# Patient Record
Sex: Female | Born: 1980 | Race: White | Hispanic: No | Smoking: Never smoker
Health system: Southern US, Community
[De-identification: ages and names within clinical notes are randomized; demographics above are authoritative.]

## PROBLEM LIST (undated history)

## (undated) ENCOUNTER — Emergency Department (HOSPITAL_COMMUNITY): Admission: EM | Discharge status: Against Medical Advice | Payer: Medicaid Other

## (undated) DIAGNOSIS — M329 Systemic lupus erythematosus, unspecified: Secondary | ICD-10-CM

## (undated) DIAGNOSIS — IMO0002 Reserved for concepts with insufficient information to code with codable children: Secondary | ICD-10-CM

## (undated) DIAGNOSIS — Q613 Polycystic kidney, unspecified: Secondary | ICD-10-CM

---

## 2006-06-28 ENCOUNTER — Emergency Department (HOSPITAL_COMMUNITY): Admission: EM | Admit: 2006-06-28 | Discharge: 2006-06-28 | Payer: Self-pay | Admitting: Emergency Medicine

## 2007-04-06 ENCOUNTER — Emergency Department (HOSPITAL_COMMUNITY): Admission: EM | Admit: 2007-04-06 | Discharge: 2007-04-06 | Payer: Self-pay | Admitting: Emergency Medicine

## 2007-04-11 ENCOUNTER — Inpatient Hospital Stay (HOSPITAL_COMMUNITY): Admission: AD | Admit: 2007-04-11 | Discharge: 2007-04-11 | Payer: Self-pay | Admitting: Obstetrics and Gynecology

## 2007-04-11 ENCOUNTER — Ambulatory Visit: Payer: Self-pay | Admitting: *Deleted

## 2009-10-24 ENCOUNTER — Ambulatory Visit (HOSPITAL_COMMUNITY): Admission: RE | Admit: 2009-10-24 | Discharge: 2009-10-24 | Payer: Self-pay | Admitting: Obstetrics and Gynecology

## 2009-11-21 ENCOUNTER — Ambulatory Visit (HOSPITAL_COMMUNITY): Admission: RE | Admit: 2009-11-21 | Discharge: 2009-11-21 | Payer: Self-pay | Admitting: Obstetrics and Gynecology

## 2009-12-28 ENCOUNTER — Ambulatory Visit (HOSPITAL_COMMUNITY): Admission: RE | Admit: 2009-12-28 | Discharge: 2009-12-28 | Payer: Self-pay | Admitting: Obstetrics and Gynecology

## 2010-01-25 ENCOUNTER — Ambulatory Visit (HOSPITAL_COMMUNITY): Admission: RE | Admit: 2010-01-25 | Discharge: 2010-01-25 | Payer: Self-pay | Admitting: Obstetrics and Gynecology

## 2010-02-14 ENCOUNTER — Ambulatory Visit (HOSPITAL_COMMUNITY): Admission: RE | Admit: 2010-02-14 | Discharge: 2010-02-14 | Payer: Self-pay | Admitting: Obstetrics and Gynecology

## 2010-03-12 ENCOUNTER — Ambulatory Visit: Payer: Self-pay | Admitting: Emergency Medicine

## 2010-03-12 DIAGNOSIS — J45909 Unspecified asthma, uncomplicated: Secondary | ICD-10-CM | POA: Insufficient documentation

## 2010-03-12 DIAGNOSIS — J309 Allergic rhinitis, unspecified: Secondary | ICD-10-CM | POA: Insufficient documentation

## 2010-03-12 DIAGNOSIS — K219 Gastro-esophageal reflux disease without esophagitis: Secondary | ICD-10-CM | POA: Insufficient documentation

## 2010-04-09 ENCOUNTER — Ambulatory Visit: Payer: Self-pay | Admitting: Emergency Medicine

## 2010-05-07 ENCOUNTER — Encounter: Payer: Self-pay | Admitting: Emergency Medicine

## 2010-05-07 ENCOUNTER — Ambulatory Visit (HOSPITAL_COMMUNITY): Admission: RE | Admit: 2010-05-07 | Discharge: 2010-05-07 | Payer: Self-pay | Admitting: Emergency Medicine

## 2010-05-15 ENCOUNTER — Ambulatory Visit: Payer: Self-pay | Admitting: Emergency Medicine

## 2010-07-30 ENCOUNTER — Ambulatory Visit: Payer: Self-pay | Admitting: Emergency Medicine

## 2010-11-26 ENCOUNTER — Inpatient Hospital Stay (HOSPITAL_COMMUNITY)
Admission: EM | Admit: 2010-11-26 | Discharge: 2010-11-29 | Payer: Self-pay | Source: Home / Self Care | Attending: Internal Medicine | Admitting: Internal Medicine

## 2010-11-27 LAB — COMPREHENSIVE METABOLIC PANEL
ALT: 10 U/L (ref 0–35)
AST: 19 U/L (ref 0–37)
AST: 21 U/L (ref 0–37)
Albumin: 3.6 g/dL (ref 3.5–5.2)
BUN: 5 mg/dL — ABNORMAL LOW (ref 6–23)
CO2: 23 mEq/L (ref 19–32)
Calcium: 8.8 mg/dL (ref 8.4–10.5)
Creatinine, Ser: 0.65 mg/dL (ref 0.4–1.2)
GFR calc Af Amer: >60 mL/min (ref >60)
GFR calc Af Amer: >60 mL/min (ref >60)
GFR calc non Af Amer: >60 mL/min (ref >60)
Glucose, Bld: 136 mg/dL — ABNORMAL HIGH (ref 70–99)
Potassium: 3.6 mEq/L (ref 3.5–5.1)
Sodium: 139 mEq/L (ref 135–145)
Sodium: 138 mEq/L (ref 135–145)
Total Bilirubin: 0.7 mg/dL (ref 0.3–1.2)
Total Protein: 6.4 g/dL (ref 6.0–8.3)
Total Protein: 6 g/dL (ref 6.0–8.3)

## 2010-11-27 LAB — CBC
HCT: 36.5 % (ref 36.0–46.0)
HCT: 36.5 % (ref 36.0–46.0)
Hemoglobin: 11.7 g/dL — ABNORMAL LOW (ref 12.0–15.0)
MCH: 26.7 pg (ref 26.0–34.0)
MCHC: 31.8 g/dL (ref 30.0–36.0)
MCHC: 31.7 g/dL (ref 30.0–36.0)
MCV: 84.1 fL (ref 78.0–100.0)
MCV: 84.1 fL (ref 78.0–100.0)
Platelets: 129 10*3/uL — ABNORMAL LOW (ref 150–400)
Platelets: 139 10*3/uL — ABNORMAL LOW (ref 150–400)
RBC: 4.34 MIL/uL (ref 3.87–5.11)
RBC: 4.15 MIL/uL (ref 3.87–5.11)
RDW: 13.7 % (ref 11.5–15.5)
RDW: 14.1 % (ref 11.5–15.5)
WBC: 6.4 10*3/uL (ref 4.0–10.5)

## 2010-11-27 LAB — BASIC METABOLIC PANEL
BUN: 7 mg/dL (ref 6–23)
Chloride: 109 mEq/L (ref 96–112)
Potassium: 4.1 mEq/L (ref 3.5–5.1)
Sodium: 138 mEq/L (ref 135–145)

## 2010-11-27 LAB — VITAMIN B12: Vitamin B-12: 828 pg/mL (ref 211–911)

## 2010-11-27 LAB — URINALYSIS, ROUTINE W REFLEX MICROSCOPIC
Hgb urine dipstick: NEGATIVE
Ketones, ur: NEGATIVE mg/dL
Protein, ur: NEGATIVE mg/dL
Urobilinogen, UA: 0.2 mg/dL (ref 0.0–1.0)

## 2010-11-27 LAB — DIFFERENTIAL
Basophils Absolute: 0 10*3/uL (ref 0.0–0.1)
Basophils Relative: 0 % (ref 0–1)
Eosinophils Absolute: 0 10*3/uL (ref 0.0–0.7)
Monocytes Relative: 3 % (ref 3–12)

## 2010-11-27 LAB — RAPID URINE DRUG SCREEN, HOSP PERFORMED
Barbiturates: NOT DETECTED
Benzodiazepines: POSITIVE — AB
Cocaine: NOT DETECTED
Opiates: NOT DETECTED

## 2010-11-27 LAB — IRON AND TIBC
Iron: 63 ug/dL (ref 42–135)
UIBC: 239 ug/dL

## 2010-11-27 LAB — TSH: TSH: 0.374 u[IU]/mL (ref 0.350–4.500)

## 2010-11-27 LAB — FERRITIN: Ferritin: 6 ng/mL — ABNORMAL LOW (ref 10–291)

## 2010-11-27 LAB — PREGNANCY, URINE: Preg Test, Ur: NEGATIVE

## 2010-12-03 NOTE — Assessment & Plan Note (Signed)
Summary: asthma, allergies   Visit Type:  Follow-up Primary Provider/Referring Provider:  [facility], [place]  CC:  Followup asthma.  Pt states that after she first stopped qvar she noticed increased SOB.  She states that after she has started to exercise daily- walking and jogging and this has helped.  She denies any new complaints.  .  History of Present Illness: 30 yo never smoker, hx of childhood asthma and allergies not meds at that time, grew out of it. Then began to have more symptoms in early 20's. Dx with asthma with spirometry 2-3 yrs ago, was started on albuterol. Currently on QVAR and combivent, managed by [facility], [place]. Was not on meds during recent pregnancy (delivered April 24), symptoms worse. Had to be re-admitted to [facility] a week after the C-section.   Tells me that she has had progressive dyspnea, chest tightness, pain in back and chest; has cough, worst at night when lying flat, can wake her from sleep. Has had GERD in the past, doesn't complain of GERD now. Having allergy symptoms, worse this Spring.   ROV 04/09/10 -- returns for f/u probable asthma, GERD and allergies. We started allergy regimen last time, with success. She has URI symptoms x 1 week, rx for L ear infxn, has made her cough worse. Also has needed some SABA this week, was not requiring it prior.   ROV 05/15/10 -- returns for eval of allergies, cough and asthma. Since last time has been doing well. Adding loratadine and nasal steroid has helped her cough. No exacerbations since last time. She had methacholine on 05/07/10 that showed no airways hyperresponsiveness, but this study was done while she was on the QVAR two times a day!   ROV 07/30/10 -- asthma, cough, allergies. negative methacholine in 05/07/10 (on her QVAR!). We stopped QVAR last time, she has tolerated this well. hasn't required frequent SABA use. Has gone back to exercising. Occas gets cough and chest tightness at night while lying  flat.   Current Medications (verified): 1)  Ventolin Hfa 108 (90 Base) Mcg/act Aers (Albuterol Sulfate) .... 2 Puffs Q4h As Needed For Sob  Allergies (verified): 1)  ! Demerol (Meperidine Hcl)  Vital Signs:  Patient profile:   30 year old female Weight:      138.38 pounds O2 Sat:      98 % on Room air Temp:     98.2 degrees F oral Pulse rate:   109 / minute BP sitting:   102 / 70  (left arm)  Vitals Entered By: Vernie Murders (July 30, 2010 3:15 PM)  O2 Flow:  Room air  Physical Exam  General:  normal appearance, healthy appearing, and thin.   Head:  normocephalic and atraumatic Eyes:  conjunctiva and sclera clear Nose:  some mild nasal congestion Mouth:  no deformity or lesions Neck:  no stridor Chest Wall:  no deformities noted Lungs:  clear bilaterally to auscultation and percussion Heart:  regular rate and rhythm, S1, S2 without murmurs, rubs, gallops, or clicks Abdomen:  not examined Msk:  no deformity or scoliosis noted with normal posture Extremities:  no clubbing, cyanosis, edema, or deformity noted Neurologic:  non-focal Skin:  intact without lesions or rashes Psych:  alert and cooperative; normal mood and affect; normal attention span and concentration   Impression & Recommendations:  Problem # 1:  ASTHMA (ICD-493.90) Unclear whether she has true asthma - methacholine was negative but done on ICS. has tolerated d/c of QVAR - continue  SABA as needed - rov 6 mo or as needed   Problem # 2:  ALLERGIC RHINITIS (ICD-477.9)  Quiet right now - restart claritin if she flares.   Orders: Est. Patient Level III ([address])  Patient Instructions: 1)  Continue to have your Ventolin avalable to use as needed  2)  You can restart claritin 10mg  by mouth once daily if your allergies flare. 3)  Follow up with Dr Delton Coombes in 6 months or as needed  Prescriptions: VENTOLIN HFA 108 (90 BASE) MCG/ACT AERS (ALBUTEROL SULFATE) 2 puffs q4h as needed for SOB  #1 x 11   Entered  and Authorized by:   Leslye Peer MD   Signed by:   Leslye Peer MD on 07/30/2010   Method used:   Electronically to        [facility]  [address] (retail)       [address]       [place], [place]  [address]       Ph: [phone] or [phone]       Fax: [phone]   RxID:   [id number]

## 2010-12-03 NOTE — Miscellaneous (Signed)
Summary: Orders Update   Clinical Lists Changes  Orders: Added new Service order of Est. Patient Level IV (99214) - Signed 

## 2010-12-03 NOTE — Procedures (Signed)
Summary: Spirometry/[place]  Spirometry/[place]   Imported By: Sherian Rein 05/21/2010 09:02:42  _____________________________________________________________________  External Attachment:    Type:   Image     Comment:   External Document

## 2010-12-03 NOTE — Assessment & Plan Note (Signed)
Summary: asthma, allergies   Visit Type:  Initial Consult Primary Provider/Referring Provider:  [facility] [place]  CC:  Asthma Consult self referral .  History of Present Illness: 30 yo never smoker, hx of childhood asthma and allergies not meds at that time, grew out of it. Then began to have more symptoms in early 20's. Dx with asthma with spirometry 2-3 yrs ago, was started on albuterol. Currently on QVAR and combivent, managed by [facility], [place]. Was not on meds during recent pregnancy (delivered April 24), symptoms worse. Had to be re-admitted to [facility] a week after the C-section.   Tells me that she has had progressive dyspnea, chest tightness, pain in back and chest; has cough, worst at night when lying flat, can wake her from sleep. Has had GERD in the past, doesn't complain of GERD now. Having allergy symptoms, worse this Spring.   Allergies (verified): 1)  ! 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    Vital Signs:  Patient profile:   30 year old female Height:      70 inches Weight:      131 pounds BMI:     18.86 O2 Sat:      98 % on Room air Temp:     98 degrees F oral Pulse rate:   76 / minute BP sitting:   104 / 78  (left arm)  Vitals Entered By: Renold Genta RCP, LPN (Mar 12, 2010 9:10 AM)  O2 Flow:  Room air CC: Asthma Consult self referral  Comments Medications reviewed with patient Renold Genta RCP, LPN  Mar 12, 2010 9:10 AM    Physical Exam  General:  normal appearance, healthy appearing, and  thin.   Head:  normocephalic and atraumatic Eyes:  conjunctiva and sclera clear Nose:  no deformity, discharge, inflammation, or lesions Mouth:  no deformity or lesions Neck:  no stridor Chest Wall:  no deformities noted Lungs:  clear bilaterally to auscultation and percussion Heart:  regular rate and rhythm, S1, S2 without murmurs, rubs, gallops, or clicks Abdomen:  not examined Msk:  no deformity or scoliosis noted with normal posture Extremities:  no clubbing, cyanosis, edema, or deformity noted Neurologic:  non-focal Skin:  intact without lesions or rashes Psych:  alert and cooperative; normal mood and affect; normal attention span and concentration   Pulmonary Function Test Date: 03/12/2010 10:03 AM Gender: Female  Pre-Spirometry FVC    Value: 4.09 L/min   % Pred: 90.60 % FEV1    Value: 3.50 L     Pred: 3.81 L     % Pred: 91.80 % FEV1/FVC  Value: 85.58 %     % Pred: 100.30 %  Impression & Recommendations:  Problem # 1:  ASTHMA (ICD-493.90) Probable asthma with marginal control, likely due to allergies and ? GERD. Cleda Daub today with normal airflows pre-BD. Not celar to me why she is on combivent instead of albuterol alone.  - d/c combivent  - continue QVAR and use Ventolin as needed  - attempt to control allergies as below - empiric rx for GERD - will get  either full PFT or more likely methacholine challenge after next visit; want to see how she responds to allergy regimen first - rov 3 -4 week  Problem # 2:  ALLERGIC RHINITIS (ICD-477.9)  Her updated medication list for this problem includes:    Loratadine 10 Mg Tabs (Loratadine) .Marland Kitchen... 1 by mouth once daily    Fluticasone Propionate 50 Mcg/act Susp (Fluticasone propionate) .Marland Kitchen... 2 sprays each nostril two times a day  Problem # 3:  GERD (ICD-530.81) empiric omeprazole  Medications Added to Medication List This Visit: 1)  Qvar 40 Mcg/act Aers (Beclomethasone dipropionate) .... 2 puffs two times a day 2)  Combivent  18-103 Mcg/act Aero (Ipratropium-albuterol) .... 2 puffs four times a day 3)  Ventolin Hfa 108 (90 Base) Mcg/act Aers (Albuterol sulfate) .... 2 puffs q4h as needed for sob 4)  Loratadine 10 Mg Tabs (Loratadine) .Marland Kitchen.. 1 by mouth once daily 5)  Fluticasone Propionate 50 Mcg/act Susp (Fluticasone propionate) .... 2 sprays each nostril two times a day  Other Orders: Consultation Level IV (16109) Spirometry w/Graph ([address]) Prescription Created Electronically [phone])  Patient Instructions: 1)  Continue QVAR 40, 2 puffs two times a day. Rinse your mouth out after you use. 2)  Stop combivent 3)  Start Ventolin 2 puffs as needed  4)  Start loratadine 10mg  by mouth once daily (Claritin) 5)  Start fluticasone 2 sprays each nostril two times a day  6)  We will arrange more pulmonary function testing after your next visit 7)  Follow up with Dr Delton Coombes in 3 -4 weeks  Prescriptions: FLUTICASONE PROPIONATE 50 MCG/ACT SUSP (FLUTICASONE PROPIONATE) 2 sprays each nostril two times a day  #1 x 5   Entered and Authorized by:   Leslye Peer MD   Signed by:   Leslye Peer MD on 03/12/2010   Method used:   Electronically to        [facility]  [address] (retail)       [address]       [place], [place]  [address]       Ph: [phone] or [phone]       Fax: [phone]   RxID:   [id number] LORATADINE 10 MG TABS (LORATADINE) 1 by mouth once daily  #30 x 5   Entered and Authorized by:   Leslye Peer MD   Signed by:   Leslye Peer MD on 03/12/2010   Method used:   Electronically to        [facility]  [address] (retail)       [address]       [place], [place]  [address]       Ph: [phone] or [phone]       Fax: [phone]   RxID:   [id number] VENTOLIN HFA 108 (90 BASE) MCG/ACT AERS (ALBUTEROL SULFATE) 2 puffs q4h as needed for SOB  #1 x 5   Entered and Authorized by:   Leslye Peer MD   Signed by:   Leslye Peer MD on 03/12/2010    Method used:   Electronically to        [facility]  [address] (retail)       [address]       [place], [place]  [address]  Ph: [phone] or [phone]       Fax: [phone]   RxID:   [id number] QVAR 40 MCG/ACT AERS (BECLOMETHASONE DIPROPIONATE) 2 puffs two times a day  #1 x 5   Entered and Authorized by:   Leslye Peer MD   Signed by:   Leslye Peer MD on 03/12/2010   Method used:   Electronically to        [facility]  [address] (retail)       [address]       [place], [place]  [address]       Ph: [phone] or [phone]       Fax: [phone]   RxID:   [id number]    [facility] Spirometry  ID: [id number] Patient: Christina Tyler, Christina Tyler DOB: November 10, 1980 Age: 30 Years Old Sex: Female Race: White Height: 70 Weight: 131 Smoker: No Status: Confirmed Recorded: 03/12/2010 10:03 AM  Parameter  Measured Predicted %Predicted FVC     4.09        4.51        90.60 FEV1     3.50        3.81        91.80 FEV1%   85.58        85.28        100.30 PEF    7.37        7.87        93.70   Interpretation: Pre: FVC= 4.09L FEV1= 3.50L FEV1%= 85.6% 3.50/4.09 FEV1/FVC (03/12/2010 10:05:05 AM),   Normal airflows. RSB

## 2010-12-03 NOTE — Assessment & Plan Note (Signed)
Summary: allergies, ? asthma   Visit Type:  Follow-up Primary Provider/Referring Provider:  [facility] [place]  CC:  followup on methacholine test.  History of Present Illness: 30 yo never smoker, hx of childhood asthma and allergies not meds at that time, grew out of it. Then began to have more symptoms in early 20's. Dx with asthma with spirometry 2-3 yrs ago, was started on albuterol. Currently on QVAR and combivent, managed by [facility], [place]. Was not on meds during recent pregnancy (delivered April 24), symptoms worse. Had to be re-admitted to [facility] a week after the C-section.   Tells me that she has had progressive dyspnea, chest tightness, pain in back and chest; has cough, worst at night when lying flat, can wake her from sleep. Has had GERD in the past, doesn't complain of GERD now. Having allergy symptoms, worse this Spring.   ROV 04/09/10 -- returns for f/u probable asthma, GERD and allergies. We started allergy regimen last time, with success. She has URI symptoms x 1 week, rx for L ear infxn, has made her cough worse. Also has needed some SABA this week, was not requiring it prior.   ROV 05/15/10 -- returns for eval of allergies, cough and asthma. Since last time has been doing well. Adding loratadine and nasal steroid has helped her cough. No exacerbations since last time. She had methacholine on 05/07/10 that showed no airways hyperresponsiveness, but this study was done while she was on the QVAR two times a day!   Current Medications (verified): 1)  Qvar 40 Mcg/act Aers (Beclomethasone Dipropionate) .... 2 Puffs Two Times A Day 2)  Ventolin Hfa 108 (90 Base) Mcg/act Aers (Albuterol Sulfate) .... 2 Puffs Q4h As Needed For Sob 3)  Loratadine 10 Mg Tabs (Loratadine) .Marland Kitchen.. 1 By Mouth Once Daily 4)  Fluticasone Propionate 50 Mcg/act Susp (Fluticasone Propionate) .... 2 Sprays Each Nostril Two Times A Day 5)  Prednisone (Pak) 10 Mg Tabs (Prednisone) .... For 6  Days  Allergies (verified): 1)  ! Demerol (Meperidine Hcl)  Vital Signs:  Patient profile:   30 year old female Height:      70 inches Weight:      136.38 pounds BMI:     19.64 O2 Sat:      97 % on Room air Pulse rate:   77 / minute BP sitting:   90 / 68  (right arm) Cuff size:   regular  Vitals Entered By: Kandice Hams CMA (May 15, 2010 2:00 PM)  O2 Flow:  Room air  Physical Exam  General:  normal appearance, healthy appearing, and thin.   Head:  normocephalic and atraumatic Eyes:  conjunctiva and sclera clear Nose:  some mild nasal congestion Mouth:  no deformity or lesions Neck:  no stridor Chest Wall:  no deformities noted Lungs:  clear bilaterally to auscultation and percussion Heart:  regular rate and rhythm, S1, S2 without murmurs, rubs, gallops, or clicks Abdomen:  not examined Msk:  no deformity or scoliosis noted with normal posture Extremities:  no clubbing, cyanosis, edema, or deformity noted Neurologic:  non-focal Skin:  intact without lesions or rashes Psych:  alert and cooperative; normal mood and affect; normal attention span and concentration   Impression & Recommendations:  Problem # 1:  ALLERGIC RHINITIS (ICD-477.9)  Her updated medication list for this problem includes:    Loratadine 10 Mg Tabs (Loratadine) .Marland Kitchen... 1 by mouth once daily    Fluticasone Propionate 50 Mcg/act Susp (Fluticasone propionate) .Marland KitchenMarland KitchenMarland KitchenMarland Kitchen  2 sprays each nostril two times a day  Problem # 2:  ASTHMA (ICD-493.90) Still not clear to me that we can rule out asthma because her normal methacholine was done on QVAR. She is willing to trial off the QVAR, assess her symptoms and airflows. If she worsens we will either restart QVAR or repeat the methacholine off meds. Her peak flow today was  ~460.  - stop QVAR - monitor symptoms and peak flows - rov in 6 weeks to assess status off med - ventolin as needed  - consider repeat methacholine vs empiric restart of QVAR i fshe declines.    Medications Added to Medication List This Visit: 1)  Prednisone (pak) 10 Mg Tabs (Prednisone) .... For 6 days  Patient Instructions: 1)  Stop QVAR 2)  Journal your symptoms and your peak flows for our follow up visit 3)  Keep Ventolin available to use as needed  4)  Follow up with Dr Delton Coombes in 6 weeks or as needed

## 2010-12-03 NOTE — Assessment & Plan Note (Signed)
Summary: allergies, asthma   Visit Type:  Follow-up Primary Provider/Referring Provider:  [facility] [place]  CC:  The patient c/o cough with clear mucus that has gotten worse since starting Augmentin for ear infection. she also c/o chest tightness. SOB is about the same. No wheezing.Marland Kitchen  History of Present Illness: 30 yo never smoker, hx of childhood asthma and allergies not meds at that time, grew out of it. Then began to have more symptoms in early 20's. Dx with asthma with spirometry 2-3 yrs ago, was started on albuterol. Currently on QVAR and combivent, managed by [facility], [place]. Was not on meds during recent pregnancy (delivered April 24), symptoms worse. Had to be re-admitted to [facility] a week after the C-section.   Tells me that she has had progressive dyspnea, chest tightness, pain in back and chest; has cough, worst at night when lying flat, can wake her from sleep. Has had GERD in the past, doesn't complain of GERD now. Having allergy symptoms, worse this Spring.   ROV 04/09/10 -- returns for f/u probable asthma, GERD and allergies. We started allergy regimen last time, with success. She has URI symptoms x 1 week, rx for L ear infxn, has made her cough worse. Also has needed some SABA this week, was not requiring it prior.  h Current Medications (verified): 1)  Qvar 40 Mcg/act Aers (Beclomethasone Dipropionate) .... 2 Puffs Two Times A Day 2)  Ventolin Hfa 108 (90 Base) Mcg/act Aers (Albuterol Sulfate) .... 2 Puffs Q4h As Needed For Sob 3)  Loratadine 10 Mg Tabs (Loratadine) .Marland Kitchen.. 1 By Mouth Once Daily 4)  Fluticasone Propionate 50 Mcg/act Susp (Fluticasone Propionate) .... 2 Sprays Each Nostril Two Times A Day 5)  Augmentin 875-125 Mg Tabs (Amoxicillin-Pot Clavulanate) .Marland Kitchen.. 1 By Mouth Two Times A Day  Allergies (verified): 1)  ! Demerol (Meperidine Hcl)  Vital Signs:  Patient profile:   30 year old female Height:      70 inches (177.80 cm) Weight:      132  pounds (60.00 kg) BMI:     19.01 O2 Sat:      98 % on Room air Temp:     98.2 degrees F (36.78 degrees C) oral Pulse rate:   77 / minute BP sitting:   102 / 68  (right arm) Cuff size:   regular  Vitals Entered By: Michel Bickers CMA (April 09, 2010 11:30 AM)  O2 Sat at Rest %:  98 O2 Flow:  Room air CC: The patient c/o cough with clear mucus that has gotten worse since starting Augmentin for ear infection. she also c/o chest tightness. SOB is about the same. No wheezing. Comments Medications reviewed. Daytime phone verified. Michel Bickers CMA  April 09, 2010 11:31 AM   Physical Exam  General:  normal appearance, healthy appearing, and thin.   Head:  normocephalic and atraumatic Eyes:  conjunctiva and sclera clear Nose:  some mild nasal congestion Mouth:  no deformity or lesions Neck:  no stridor Chest Wall:  no deformities noted Lungs:  clear bilaterally to auscultation and percussion Heart:  regular rate and rhythm, S1, S2 without murmurs, rubs, gallops, or clicks Abdomen:  not examined Msk:  no deformity or scoliosis noted with normal posture Extremities:  no clubbing, cyanosis, edema, or deformity noted Neurologic:  non-focal Skin:  intact without lesions or rashes Psych:  alert and cooperative; normal mood and affect; normal attention span and concentration   Impression & Recommendations:  Problem # 1:  ASTHMA (ICD-493.90)  Problem # 2:  ALLERGIC RHINITIS (ICD-477.9)  Her updated medication list for this problem includes:    Loratadine 10 Mg Tabs (Loratadine) .Marland Kitchen... 1 by mouth once daily    Fluticasone Propionate 50 Mcg/act Susp (Fluticasone propionate) .Marland Kitchen... 2 sprays each nostril two times a day  Orders: Est. Patient Level IV ([address])  Medications Added to Medication List This Visit: 1)  Augmentin 875-125 Mg Tabs (Amoxicillin-pot clavulanate) .Marland Kitchen.. 1 by mouth two times a day  Other Orders: Pulmonary Referral (Pulmonary)  Patient Instructions: 1)  Please continue  your loratadine and fluticisone spray as you are taking them.  2)  Use QVAR two times a day  3)  Use Ventolin as needed.  4)  We will schedule a methacholine challenge at [facility] to assess your asthma.  5)  Follow up with Dr Delton Coombes after your testing is completed to review the results.
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
## 2011-01-10 ENCOUNTER — Ambulatory Visit: Payer: Self-pay | Admitting: Emergency Medicine

## 2011-01-13 ENCOUNTER — Telehealth: Payer: Self-pay | Admitting: Emergency Medicine

## 2011-01-21 NOTE — Progress Notes (Signed)
Summary: nos appt  Phone Note Call from Patient   Caller: [email or web address]  Call For: byrum Summary of Call: Rsc nos from 3/9 to 4/11. Initial call taken by: Darletta Moll,  January 13, 2011 9:52 AM

## 2011-02-10 ENCOUNTER — Encounter: Payer: Self-pay | Admitting: Emergency Medicine

## 2011-02-12 ENCOUNTER — Encounter: Payer: Self-pay | Admitting: Emergency Medicine

## 2011-02-12 ENCOUNTER — Ambulatory Visit (INDEPENDENT_AMBULATORY_CARE_PROVIDER_SITE_OTHER): Payer: Medicaid Other | Admitting: Emergency Medicine

## 2011-02-12 DIAGNOSIS — J309 Allergic rhinitis, unspecified: Secondary | ICD-10-CM

## 2011-02-12 DIAGNOSIS — J45909 Unspecified asthma, uncomplicated: Secondary | ICD-10-CM

## 2011-02-12 MED ORDER — LORATADINE 10 MG PO TABS: 10.0000 mg | ORAL_TABLET | Freq: Every day | ORAL | Status: DC

## 2011-02-12 MED ORDER — FLUTICASONE PROPIONATE 50 MCG/ACT NA SUSP: 2.0000 | Freq: Two times a day (BID) | NASAL | Status: DC

## 2011-02-12 NOTE — Assessment & Plan Note (Signed)
Add loratadine + fluticasone during the allergy season

## 2011-02-12 NOTE — Patient Instructions (Addendum)
Continue to use Ventolin as needed Start loratadine 10mg  daily Start fluticasone nasal spray, 2 sprays each nostril twice a day We may decide to repeat your methacholine challenge at some point in the future.  Follow up with Dr Delton Coombes in 6 months or sooner if you develop any problems.

## 2011-02-12 NOTE — Assessment & Plan Note (Signed)
Continue off ICS, use prn SABA alone Consider repeat methacholine at some point in future now that she is off ICS rov 6 months or prn

## 2011-02-12 NOTE — Progress Notes (Signed)
  Subjective:    Patient ID: Christina Tyler, female    DOB: Mar 05, 1981, 30 y.o.   MRN: [id number]  HPI 30 yo never smoker, hx of childhood asthma and allergies not meds at that time, grew out of it. Then began to have more symptoms in early 20's. Dx with asthma with spirometry 2-3 yrs ago, was started on albuterol. Currently on QVAR and combivent, managed by [facility], [place]. Was not on meds during recent pregnancy (delivered April 24), symptoms worse. Had to be re-admitted to [facility] a week after the C-section.   Tells me that she has had progressive dyspnea, chest tightness, pain in back and chest; has cough, worst at night when lying flat, can wake her from sleep. Has had GERD in the past, doesn't complain of GERD now. Having allergy symptoms, worse this Spring.   ROV 04/09/10 -- returns for f/u probable asthma, GERD and allergies. We started allergy regimen last time, with success. She has URI symptoms x 1 week, rx for L ear infxn, has made her cough worse. Also has needed some SABA this week, was not requiring it prior.   ROV 05/15/10 -- returns for eval of allergies, cough and asthma. Since last time has been doing well. Adding loratadine and nasal steroid has helped her cough. No exacerbations since last time. She had methacholine on 05/07/10 that showed no airways hyperresponsiveness, but this study was done while she was on the QVAR two times a day!   ROV 07/30/10 -- asthma, cough, allergies. negative methacholine in 05/07/10 (on her QVAR!). We stopped QVAR last time, she has tolerated this well. hasn't required frequent SABA use. Has gone back to exercising. Occas gets cough and chest tightness at night while lying flat.   ROV 02/12/11 -- asthma, allergies, cough. Has done well since 9/11. She can tell that her allergies are starting to act up. Drainage at night. She hasn't had to increase Ventolin usage. No exacerbations, no hospitalizations since last time.   Review of  Systems Respiratory ROS: negative for - cough, hemoptysis, orthopnea, pleuritic pain, shortness of breath, sputum changes, stridor, tachypnea or wheezing    Objective:   Physical Exam Gen: Pleasant, well-nourished, in no distress,  normal affect  ENT: No lesions,  mouth clear,  oropharynx clear, no postnasal drip  Neck: No JVD, no TMG, no carotid bruits  Lungs: No use of accessory muscles, no dullness to percussion, clear without rales or rhonchi  Cardiovascular: RRR, heart sounds normal, no murmur or gallops, no peripheral edema  Musculoskeletal: No deformities, no cyanosis or clubbing  Neuro: alert, non focal  Skin: Warm, no lesions or rashes        Assessment & Plan:  No problem-specific assessment & plan notes found for this encounter.

## 2011-03-22 ENCOUNTER — Emergency Department (HOSPITAL_COMMUNITY): Payer: Medicaid Other

## 2011-03-22 ENCOUNTER — Emergency Department (HOSPITAL_COMMUNITY)
Admission: EM | Admit: 2011-03-22 | Discharge: 2011-03-22 | Disposition: A | Discharge status: Home / Self Care | Payer: Medicaid Other | Attending: Emergency Medicine | Admitting: Emergency Medicine

## 2011-03-22 DIAGNOSIS — M79609 Pain in unspecified limb: Secondary | ICD-10-CM | POA: Insufficient documentation

## 2011-03-22 DIAGNOSIS — R209 Unspecified disturbances of skin sensation: Secondary | ICD-10-CM | POA: Insufficient documentation

## 2011-03-22 DIAGNOSIS — R51 Headache: Secondary | ICD-10-CM | POA: Insufficient documentation

## 2011-03-22 DIAGNOSIS — J45909 Unspecified asthma, uncomplicated: Secondary | ICD-10-CM | POA: Insufficient documentation

## 2011-07-31 ENCOUNTER — Other Ambulatory Visit: Payer: Self-pay | Admitting: Emergency Medicine

## 2011-08-13 ENCOUNTER — Ambulatory Visit (INDEPENDENT_AMBULATORY_CARE_PROVIDER_SITE_OTHER): Payer: Medicaid Other | Admitting: Emergency Medicine

## 2011-08-13 ENCOUNTER — Encounter: Payer: Self-pay | Admitting: Emergency Medicine

## 2011-08-13 VITALS — BP 100/70 | HR 70 | Temp 98.5°F | Ht 70.0 in | Wt 144.0 lb

## 2011-08-13 DIAGNOSIS — J45909 Unspecified asthma, uncomplicated: Secondary | ICD-10-CM

## 2011-08-13 DIAGNOSIS — Z23 Encounter for immunization: Secondary | ICD-10-CM

## 2011-08-13 NOTE — Assessment & Plan Note (Signed)
-   continue SABA prn,  - no scheduled meds at this time - rov in 6 months or prn

## 2011-08-13 NOTE — Patient Instructions (Addendum)
Please continue to use your Ventolin as needed We will not start any scheduled asthma medications at this time Flu Shot today Follow with Dr Delton Coombes in 6 months or sooner if you have any problems.

## 2011-08-13 NOTE — Progress Notes (Signed)
  Subjective:    Patient ID: Christina Tyler, female    DOB: 04/20/1981, 30 y.o.   MRN: [id number]  HPI 30 yo never smoker, hx of childhood asthma and allergies not meds at that time, grew out of it. Then began to have more symptoms in early 20's. Dx with asthma with spirometry 2-3 yrs ago, was started on albuterol. Currently on QVAR and combivent, managed by [facility], [place]. Was not on meds during recent pregnancy (delivered April 24), symptoms worse. Had to be re-admitted to [facility] a week after the C-section.   Tells me that she has had progressive dyspnea, chest tightness, pain in back and chest; has cough, worst at night when lying flat, can wake her from sleep. Has had GERD in the past, doesn't complain of GERD now. Having allergy symptoms, worse this Spring.   ROV 04/09/10 -- returns for f/u probable asthma, GERD and allergies. We started allergy regimen last time, with success. She has URI symptoms x 1 week, rx for L ear infxn, has made her cough worse. Also has needed some SABA this week, was not requiring it prior.   ROV 05/15/10 -- returns for eval of allergies, cough and asthma. Since last time has been doing well. Adding loratadine and nasal steroid has helped her cough. No exacerbations since last time. She had methacholine on 05/07/10 that showed no airways hyperresponsiveness, but this study was done while she was on the QVAR two times a day!   ROV 07/30/10 -- asthma, cough, allergies. negative methacholine in 05/07/10 (on her QVAR!). We stopped QVAR last time, she has tolerated this well. hasn't required frequent SABA use. Has gone back to exercising. Occas gets cough and chest tightness at night while lying flat.   ROV 02/12/11 -- asthma, allergies, cough. Has done well since 9/11. She can tell that her allergies are starting to act up. Drainage at night. She hasn't had to increase Ventolin usage. No exacerbations, no hospitalizations since last time.   ROV 08/13/11 -- asthma,  allergies, cough. Newly dx of SLE made since our last visit - discovered with anemia and thrombocytopenia. Not currently on medication. Asthma has been well managed until recent weather change, using SABA 2 -3 x a week. Needs flu shot.   Review of Systems Respiratory ROS: negative for - cough, hemoptysis, orthopnea, pleuritic pain, shortness of breath, sputum changes, stridor, tachypnea or wheezing    Objective:   Physical Exam Gen: Pleasant, well-nourished, in no distress,  normal affect  ENT: No lesions,  mouth clear,  oropharynx clear, no postnasal drip  Neck: No JVD, no TMG, no carotid bruits  Lungs: No use of accessory muscles, no dullness to percussion, clear without rales or rhonchi  Cardiovascular: RRR, heart sounds normal, no murmur or gallops, no peripheral edema  Musculoskeletal: No deformities, no cyanosis or clubbing  Neuro: alert, non focal  Skin: Warm, no lesions or rashes    Assessment & Plan:  ASTHMA - continue SABA prn,  - no scheduled meds at this time - rov in 6 months or prn

## 2011-08-13 NOTE — Progress Notes (Signed)
Addended by: Michel Bickers A on: 08/13/2011 03:43 PM   Modules accepted: Orders

## 2011-09-12 ENCOUNTER — Emergency Department (HOSPITAL_COMMUNITY)
Admission: EM | Admit: 2011-09-12 | Discharge: 2011-09-12 | Disposition: A | Discharge status: Home / Self Care | Payer: Medicaid Other | Attending: Emergency Medicine | Admitting: Emergency Medicine

## 2011-09-12 ENCOUNTER — Encounter (HOSPITAL_COMMUNITY): Payer: Self-pay | Admitting: *Deleted

## 2011-09-12 ENCOUNTER — Emergency Department (HOSPITAL_COMMUNITY): Payer: Medicaid Other

## 2011-09-12 DIAGNOSIS — N949 Unspecified condition associated with female genital organs and menstrual cycle: Secondary | ICD-10-CM | POA: Insufficient documentation

## 2011-09-12 DIAGNOSIS — Z79899 Other long term (current) drug therapy: Secondary | ICD-10-CM | POA: Insufficient documentation

## 2011-09-12 DIAGNOSIS — J45909 Unspecified asthma, uncomplicated: Secondary | ICD-10-CM | POA: Insufficient documentation

## 2011-09-12 DIAGNOSIS — M329 Systemic lupus erythematosus, unspecified: Secondary | ICD-10-CM | POA: Insufficient documentation

## 2011-09-12 DIAGNOSIS — R109 Unspecified abdominal pain: Secondary | ICD-10-CM | POA: Insufficient documentation

## 2011-09-12 HISTORY — DX: Reserved for concepts with insufficient information to code with codable children: IMO0002

## 2011-09-12 HISTORY — DX: Polycystic kidney, unspecified: Q61.3

## 2011-09-12 HISTORY — DX: Systemic lupus erythematosus, unspecified: M32.9

## 2011-09-12 LAB — DIFFERENTIAL
Basophils Absolute: 0.1 10*3/uL (ref 0.0–0.1)
Lymphocytes Relative: 40 % (ref 12–46)
Neutro Abs: 2 10*3/uL (ref 1.7–7.7)

## 2011-09-12 LAB — COMPREHENSIVE METABOLIC PANEL
ALT: 8 U/L (ref 0–35)
AST: 14 U/L (ref 0–37)
CO2: 26 mEq/L (ref 19–32)
Chloride: 108 mEq/L (ref 96–112)
GFR calc non Af Amer: >90 mL/min (ref >90)
Sodium: 141 mEq/L (ref 135–145)
Total Bilirubin: 0.3 mg/dL (ref 0.3–1.2)

## 2011-09-12 LAB — URINALYSIS, ROUTINE W REFLEX MICROSCOPIC
Glucose, UA: NEGATIVE mg/dL
Protein, ur: 30 mg/dL — AB
pH: 6 (ref 5.0–8.0)

## 2011-09-12 LAB — CBC
Platelets: 120 10*3/uL — ABNORMAL LOW (ref 150–400)
RDW: 13.3 % (ref 11.5–15.5)
WBC: 4.3 10*3/uL (ref 4.0–10.5)

## 2011-09-12 LAB — URINE MICROSCOPIC-ADD ON

## 2011-09-12 MED ORDER — DIAZEPAM 5 MG PO TABS: 5.0000 mg | ORAL_TABLET | Freq: Three times a day (TID) | ORAL | Status: AC | PRN

## 2011-09-12 MED ORDER — OXYCODONE-ACETAMINOPHEN 5-325 MG PO TABS: 1.0000 | ORAL_TABLET | ORAL | Status: AC | PRN

## 2011-09-12 MED ORDER — IOHEXOL 300 MG/ML  SOLN
80.0000 mL | Freq: Once | INTRAMUSCULAR | Status: AC | PRN
  Administered 2011-09-12: 80 mL via INTRAVENOUS

## 2011-09-12 MED ORDER — DIPHENHYDRAMINE HCL 50 MG/ML IJ SOLN
INTRAMUSCULAR | Status: AC
  Filled 2011-09-12: qty 1

## 2011-09-12 MED ORDER — DIPHENHYDRAMINE HCL 50 MG/ML IJ SOLN
12.5000 mg | Freq: Once | INTRAMUSCULAR | Status: AC
  Administered 2011-09-12: 12.5 mg via INTRAVENOUS

## 2011-09-12 MED ORDER — DIPHENHYDRAMINE HCL 12.5 MG/5ML PO ELIX: 12.5000 mg | ORAL_SOLUTION | Freq: Once | ORAL | Status: DC

## 2011-09-12 MED ORDER — KETOROLAC TROMETHAMINE 30 MG/ML IJ SOLN
30.0000 mg | Freq: Once | INTRAMUSCULAR | Status: AC
  Administered 2011-09-12: 30 mg via INTRAVENOUS
  Filled 2011-09-12: qty 1

## 2011-09-12 MED ORDER — MORPHINE SULFATE 4 MG/ML IJ SOLN
4.0000 mg | Freq: Once | INTRAMUSCULAR | Status: AC
  Administered 2011-09-12: 4 mg via INTRAVENOUS
  Filled 2011-09-12: qty 1

## 2011-09-12 NOTE — ED Provider Notes (Signed)
Pt seen and examined by me in CDU. PT with left flank pain for 4 weeks, worsening. Currently treated for UTI. Pt in CDU pending CT scan of abd/pelvis to r/o ruptured kidney cyst vs kidney stone.   8:12 PM Pt's CT scan resulted with no acute process. Possible that pain is musculoskeletal or related to currently treated UTI or polycystic kidney disease. WIll d/c home with pain medications and close follow up. Pt non toxic, normal vs.  Lottie Mussel, PA 09/12/11 2013

## 2011-09-12 NOTE — ED Notes (Signed)
Pt with left flank pain....states has had pain for the past week that is getting progressivly worse. States has history of polycystic kidney disease and thinks one of the cysts may have ruptured. Denies fever. Has been evaluated 3 times in past 2 weeks for this by 3 different providers.

## 2011-09-12 NOTE — ED Notes (Signed)
Pt given iv morphine...has local redness and itching at iv site.. Pa made aware  meds ordered accordingly

## 2011-09-12 NOTE — ED Provider Notes (Signed)
Evaluation and management procedures were performed by the PA/NP under my supervision/collaboration.   Dione Booze, MD 09/12/11 2023

## 2011-09-12 NOTE — ED Notes (Signed)
Pt has finished her po contrast.  Ct is aware

## 2011-09-12 NOTE — ED Provider Notes (Signed)
History     CSN: [id number] Arrival date & time: 09/12/2011 12:19 PM   First MD Initiated Contact with Patient 09/12/11 1441      Chief Complaint  Patient presents with  . Urinary Tract Infection    (Consider location/radiation/quality/duration/timing/severity/associated sxs/prior treatment) HPI History provided by patient.  Pt has h/o polycystic kidney disease.  Has had pain in L flank for the past 4-5 weeks.  Evaluated initially at [facility] ED and started on cipro for possible UTI.  Followed up with PCP and duration of abx extended.  Her nephrologist consulted and he thought pt may have a ruptured cyst.  Recommended that she come to ED if pain worsened.  Pt reports that pain has become more severe.  Describes as non-radiating stabbing/cramping.  Associated w/ hematuria last week but pt now has her period.  No other GU symptoms.  No fever.  No h/o kidney stones.   Past Medical History  Diagnosis Date  . Asthma   . Polycystic kidney disease   . Lupus     History reviewed. No pertinent past surgical history.  Family History  Problem Relation Age of Onset  . Asthma    . Heart disease      History  Substance Use Topics  . Smoking status: Never Smoker   . Smokeless tobacco: Never Used  . Alcohol Use: No    OB History    Grav Para Term Preterm Abortions TAB SAB Ect Mult Living                  Review of Systems  All other systems reviewed and are negative.    Allergies  Aspirin; Cymbalta; Dilaudid; and Nortriptyline  Home Medications   Current Outpatient Rx  Name Route Sig Dispense Refill  . CIPROFLOXACIN HCL 250 MG PO TABS Oral Take 250 mg by mouth 2 (two) times daily. For 4 days. Dr has extended. Had 2 or 3 days left.    . OXYCODONE-ACETAMINOPHEN 5-325 MG PO TABS Oral Take 1 tablet by mouth every 4 (four) hours as needed. For pain     . ALBUTEROL SULFATE HFA 108 (90 BASE) MCG/ACT IN AERS  2 puffs every 4 (four) hours as needed.       BP 96/57   Pulse 66  Temp(Src) 98 F (36.7 C) (Oral)  Resp 16  SpO2 100%  Physical Exam  Nursing note and vitals reviewed. Constitutional: She is oriented to person, place, and time. She appears well-developed and well-nourished. No distress.  HENT:  Head: Normocephalic and atraumatic.  Eyes:       Normal appearance  Neck: Normal range of motion.  Cardiovascular: Normal rate and regular rhythm.   Pulmonary/Chest: Effort normal and breath sounds normal.  Abdominal: Soft. Bowel sounds are normal. She exhibits no distension and no mass. There is no rebound and no guarding.       LUQ ttp.  Severe L CVA tenderness  Musculoskeletal:       No spinal ttp  Neurological: She is alert and oriented to person, place, and time.  Skin: Skin is warm and dry. No rash noted.  Psychiatric: She has a normal mood and affect. Her behavior is normal.    ED Course  Procedures (including critical care time)  Labs Reviewed  URINALYSIS, ROUTINE W REFLEX MICROSCOPIC - Abnormal; Notable for the following:    Color, Urine RED (*) BIOCHEMICALS MAY BE AFFECTED BY COLOR   Appearance TURBID (*)    Hgb urine  dipstick LARGE (*)    Protein, ur 30 (*)    Leukocytes, UA TRACE (*)    All other components within normal limits  CBC - Abnormal; Notable for the following:    Hemoglobin 11.8 (*)    HCT 35.9 (*)    Platelets 120 (*)    All other components within normal limits  URINE MICROSCOPIC-ADD ON  DIFFERENTIAL  COMPREHENSIVE METABOLIC PANEL  LIPASE, BLOOD  POCT PREGNANCY, URINE  URINE CULTURE  POCT PREGNANCY, URINE   No results found.   No diagnosis found.    MDM  Pt w/ h/o polycystic kidney disease presents w/ L flank pain.  Nephrologist believes she may have a ruptured cyst but pain gradually worsening.  Severe L CVT ttp as well as LUQ ttp on exam.  Discussed w/ radiologist who says that CT w/ contrast would be the best way to evaluate for ruptured cyst.  Labs sig for no UTI and neg lipase.  CT pending.  Pt  has received toradol and morphine for pain.  [place], PA-C to dispo.          Otilio Miu, [place] 09/12/11 1840

## 2011-09-12 NOTE — ED Notes (Signed)
Iv site has returned to wnl  Pt no longer complains of itching at site.

## 2011-09-12 NOTE — ED Notes (Signed)
Pt still waiting for ct ...  States her pain is relieved at this time

## 2011-09-12 NOTE — ED Provider Notes (Signed)
Evaluation and management procedures were performed by the PA/NP under my supervision/collaboration.   Silvina Hackleman, MD 09/12/11 1845 

## 2011-09-12 NOTE — ED Notes (Signed)
Pt reports left sided lower back pain x 4 weeks, is being treated for uti x 4-5 days. Pt is taking cipro. States no relief. Reports polyuria. No dysuria. No fevers.

## 2011-09-13 LAB — URINE CULTURE

## 2011-09-28 ENCOUNTER — Other Ambulatory Visit: Payer: Self-pay | Admitting: Emergency Medicine

## 2011-10-06 ENCOUNTER — Encounter: Payer: Self-pay | Admitting: Emergency Medicine

## 2011-10-06 ENCOUNTER — Ambulatory Visit (INDEPENDENT_AMBULATORY_CARE_PROVIDER_SITE_OTHER): Payer: Medicaid Other | Admitting: Emergency Medicine

## 2011-10-06 DIAGNOSIS — J984 Other disorders of lung: Secondary | ICD-10-CM

## 2011-10-06 DIAGNOSIS — R911 Solitary pulmonary nodule: Secondary | ICD-10-CM

## 2011-10-06 DIAGNOSIS — J45909 Unspecified asthma, uncomplicated: Secondary | ICD-10-CM

## 2011-10-06 DIAGNOSIS — M329 Systemic lupus erythematosus, unspecified: Secondary | ICD-10-CM | POA: Insufficient documentation

## 2011-10-06 MED ORDER — ALBUTEROL SULFATE (2.5 MG/3ML) 0.083% IN NEBU: 2.5000 mg | INHALATION_SOLUTION | Freq: Four times a day (QID) | RESPIRATORY_TRACT | Status: DC | PRN

## 2011-10-06 NOTE — Progress Notes (Signed)
  Subjective:    Patient ID: Christina Tyler, female    DOB: Jan 25, 1981, 30 y.o.   MRN: [id number]  HPI 30 yo never smoker, hx of childhood asthma and allergies not meds at that time, grew out of it. Then began to have more symptoms in early 20's. Dx with asthma with spirometry 2-3 yrs ago, was started on albuterol. Currently on QVAR and combivent, managed by [facility], [place]. Was not on meds during recent pregnancy (delivered April 24), symptoms worse. Had to be re-admitted to [facility] a week after the C-section.   Tells me that she has had progressive dyspnea, chest tightness, pain in back and chest; has cough, worst at night when lying flat, can wake her from sleep. Has had GERD in the past, doesn't complain of GERD now. Having allergy symptoms, worse this Spring.   ROV 04/09/10 -- returns for f/u probable asthma, GERD and allergies. We started allergy regimen last time, with success. She has URI symptoms x 1 week, rx for L ear infxn, has made her cough worse. Also has needed some SABA this week, was not requiring it prior.   ROV 05/15/10 -- returns for eval of allergies, cough and asthma. Since last time has been doing well. Adding loratadine and nasal steroid has helped her cough. No exacerbations since last time. She had methacholine on 05/07/10 that showed no airways hyperresponsiveness, but this study was done while she was on the QVAR two times a day!   ROV 07/30/10 -- asthma, cough, allergies. negative methacholine in 05/07/10 (on her QVAR!). We stopped QVAR last time, she has tolerated this well. hasn't required frequent SABA use. Has gone back to exercising. Occas gets cough and chest tightness at night while lying flat.   ROV 02/12/11 -- asthma, allergies, cough. Has done well since 9/11. She can tell that her allergies are starting to act up. Drainage at night. She hasn't had to increase Ventolin usage. No exacerbations, no hospitalizations since last time.   ROV 08/13/11 -- asthma,  allergies, cough. Newly dx of SLE made since our last visit - discovered with anemia and thrombocytopenia. Not currently on medication. Asthma has been well managed until recent weather change, using SABA 2 -3 x a week. Needs flu shot.   ROV 10/06/11 -- asthma, allergies, cough. SLE dx as above. Very minimal prior tobacco exposure. Her asthma has been fairly stable. She had a recent CXR when being evaluated for flank pain, showed ? 6 mm RUL nodule.   Review of Systems Respiratory ROS: negative for - cough, hemoptysis, orthopnea, pleuritic pain, shortness of breath, sputum changes, stridor, tachypnea or wheezing    Objective:   Physical Exam Gen: Pleasant, well-nourished, in no distress,  normal affect  ENT: No lesions,  mouth clear,  oropharynx clear, no postnasal drip  Neck: No JVD, no TMG, no carotid bruits  Lungs: No use of accessory muscles, no dullness to percussion, clear without rales or rhonchi  Cardiovascular: RRR, heart sounds normal, no murmur or gallops, no peripheral edema  Musculoskeletal: No deformities, no cyanosis or clubbing  Neuro: alert, non focal  Skin: Warm, no lesions or rashes    Assessment & Plan:  Pulmonary nodule, right - CT scan chest to see if present; could relate to SLE. Low risk from tobacco hx  ASTHMA - same BD's

## 2011-10-06 NOTE — Patient Instructions (Signed)
We will perform a CT scan of the chest  Please continue your usual inhaled medications.  Follow with Dr Delton Coombes in 3 -4 weeks to review the CT scan

## 2011-10-06 NOTE — Assessment & Plan Note (Signed)
-   same BD's  

## 2011-10-06 NOTE — Assessment & Plan Note (Signed)
-   CT scan chest to see if present; could relate to SLE. Low risk from tobacco hx

## 2011-10-09 ENCOUNTER — Ambulatory Visit (INDEPENDENT_AMBULATORY_CARE_PROVIDER_SITE_OTHER)
Admission: RE | Admit: 2011-10-09 | Discharge: 2011-10-09 | Disposition: A | Discharge status: Home / Self Care | Payer: Medicaid Other | Source: Ambulatory Visit | Attending: Emergency Medicine | Admitting: Emergency Medicine

## 2011-10-09 DIAGNOSIS — R911 Solitary pulmonary nodule: Secondary | ICD-10-CM

## 2011-10-09 DIAGNOSIS — J984 Other disorders of lung: Secondary | ICD-10-CM

## 2011-10-09 MED ORDER — IOHEXOL 300 MG/ML  SOLN
80.0000 mL | Freq: Once | INTRAMUSCULAR | Status: AC | PRN
  Administered 2011-10-09: 80 mL via INTRAVENOUS

## 2011-10-10 ENCOUNTER — Telehealth: Payer: Self-pay | Admitting: Emergency Medicine

## 2011-10-10 NOTE — Telephone Encounter (Signed)
I spoke with pt and she is requesting her CT results. Pt aware RB is out of the office and would like a call back when he returns next week. Please advise Dr. Delton Coombes, thanks

## 2011-10-10 NOTE — Telephone Encounter (Signed)
lmomtcb x1 

## 2011-10-10 NOTE — Telephone Encounter (Signed)
Pt is returning call & can be reached at  [phone].  Christina Tyler

## 2011-10-13 NOTE — Telephone Encounter (Signed)
Spoke with pt. She wants her CT Chest results. I advised that looks like the plan was to have her come in for rov to discuss the results and she is scheduled for appt with RB 11/06/11- she states that she will be happy to keep this appt, but is anxious to know if "spots" are still there and would like to know about this prior to her appt. Please advise, thanks!

## 2011-10-13 NOTE — Telephone Encounter (Signed)
Please let her know that there is no nodule on her CT scan - the CXR finding was a false alarm. Thanks.

## 2011-10-13 NOTE — Telephone Encounter (Signed)
Pt is checking on status of her CT results.  Pt requests to be contacted at [phone].  Christina Tyler

## 2011-10-13 NOTE — Telephone Encounter (Signed)
Pt notified of CT results per RB. The pt wants to know if she still needs to keep follow-up in January. Pls advise.

## 2011-10-16 NOTE — Telephone Encounter (Signed)
Called and spoke with pt.  Informed her of RB's recs. Pt was ok with this.  appt for Vanessa Kick has been cancelled.

## 2011-10-16 NOTE — Telephone Encounter (Signed)
I think we can cancel that appointment, follow up in 6 - 12 months instead. Thanks

## 2011-11-06 ENCOUNTER — Ambulatory Visit: Payer: Medicaid Other | Admitting: Emergency Medicine

## 2011-12-19 DIAGNOSIS — R768 Other specified abnormal immunological findings in serum: Secondary | ICD-10-CM | POA: Insufficient documentation

## 2011-12-22 IMAGING — US US OB FOLLOW-UP
1 series · 14 of 28 positions shown · non-contrast
Comparison: none

OBSTETRICAL ULTRASOUND:
 This ultrasound was performed in The [HOSPITAL], and the [facility] report will be stored to [REDACTED] PACS.  This report is also available in [HOSPITAL]?s accessANYware.

[Series 1: us ob follow-up · 0.24mm/px · 41 acquisitions, 14 frames shown]
[im 2/41]
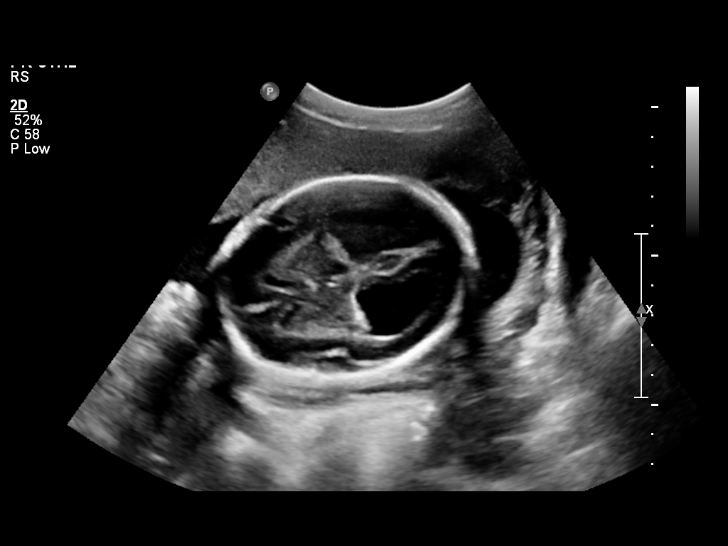
[im 5/41]
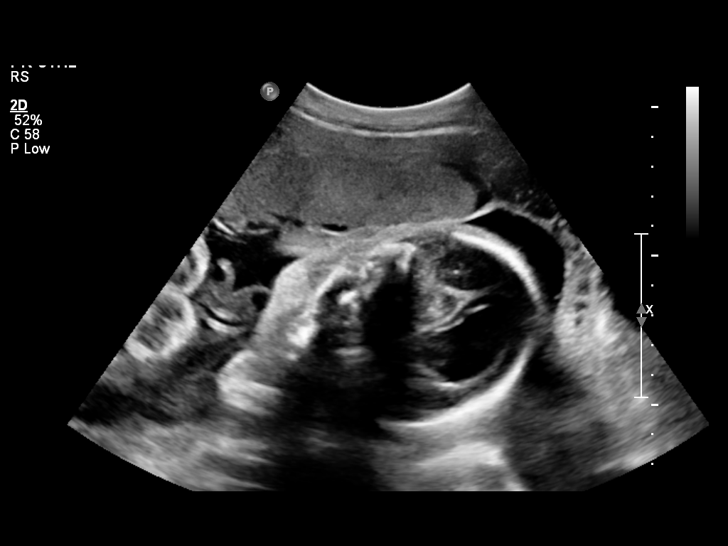
[im 8/41]
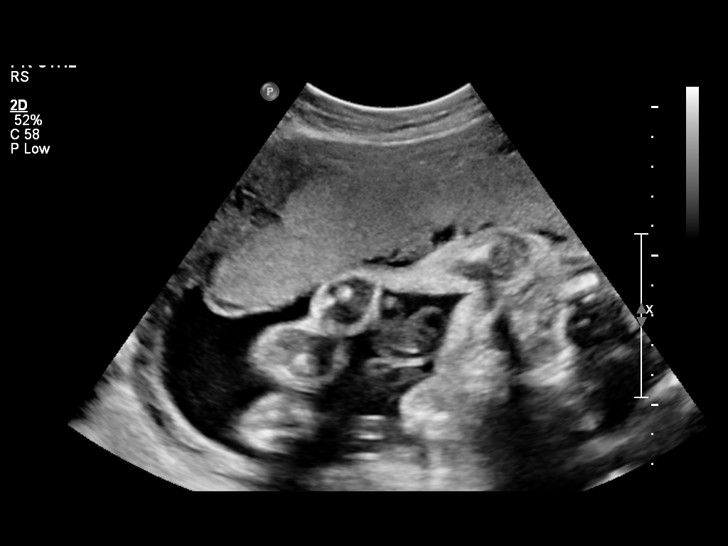
[im 11/41]
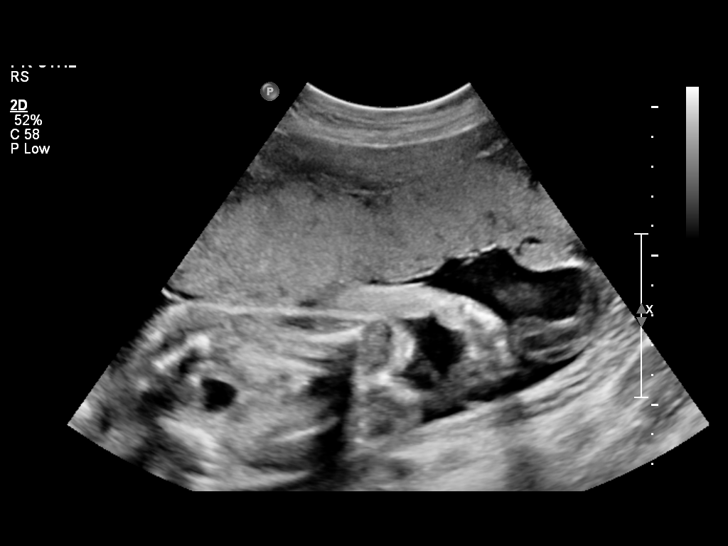
[im 14/41]
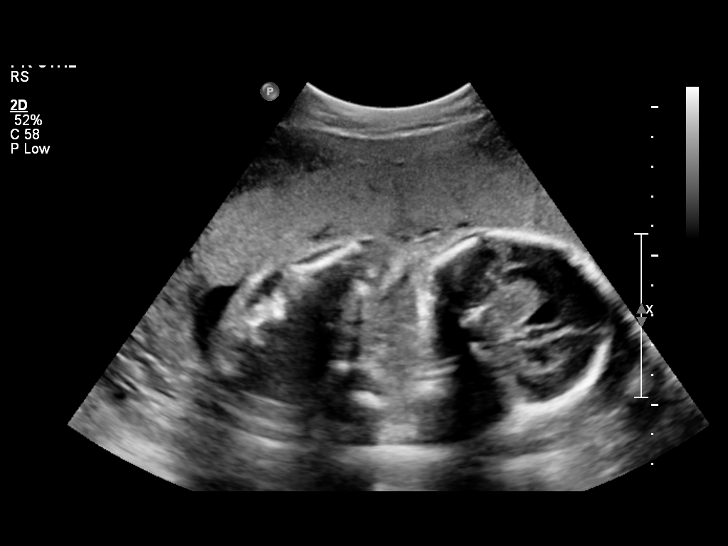
[im 17/41]
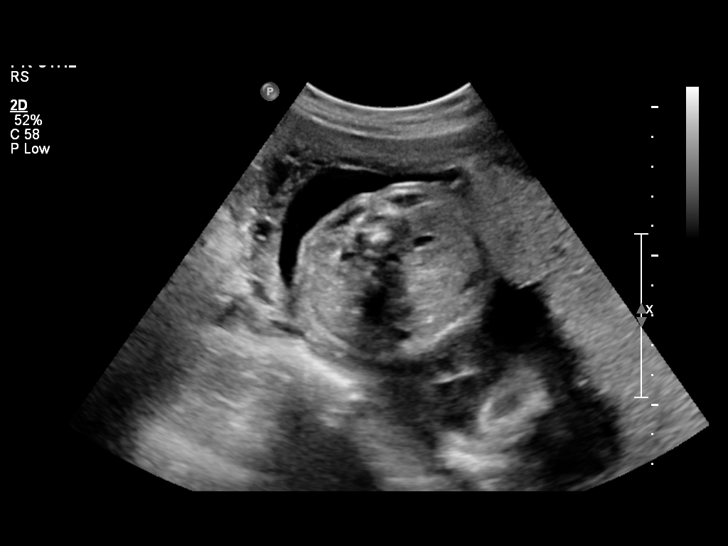
[im 20/41]
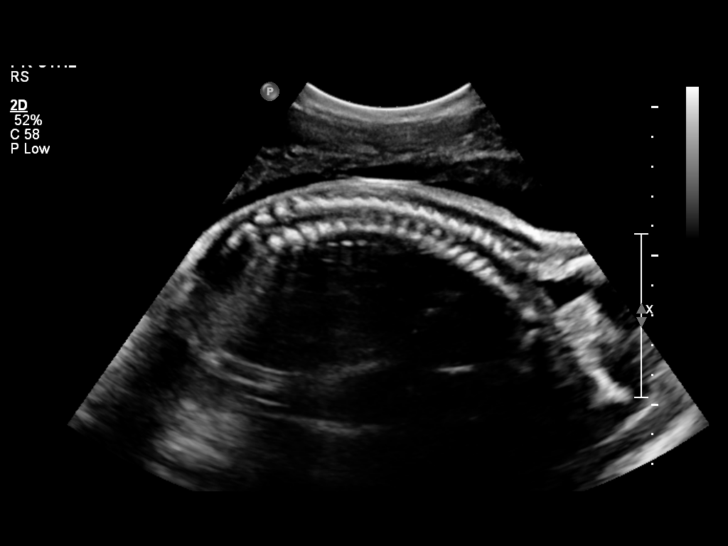
[im 23/41]
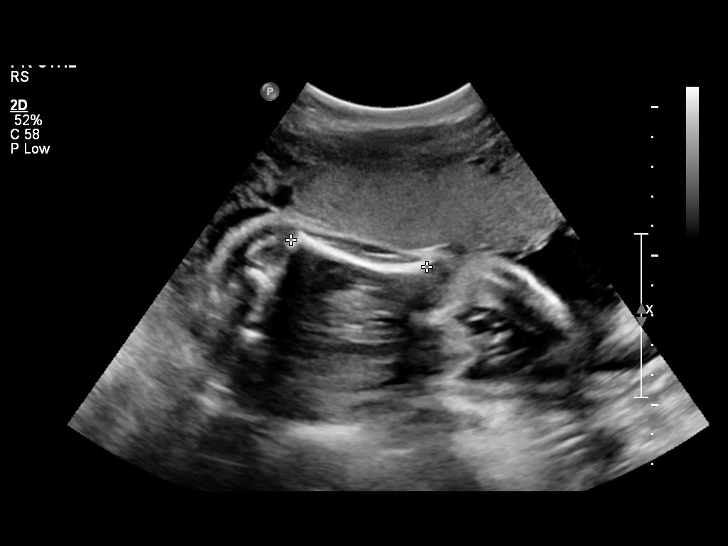
[im 26/41]
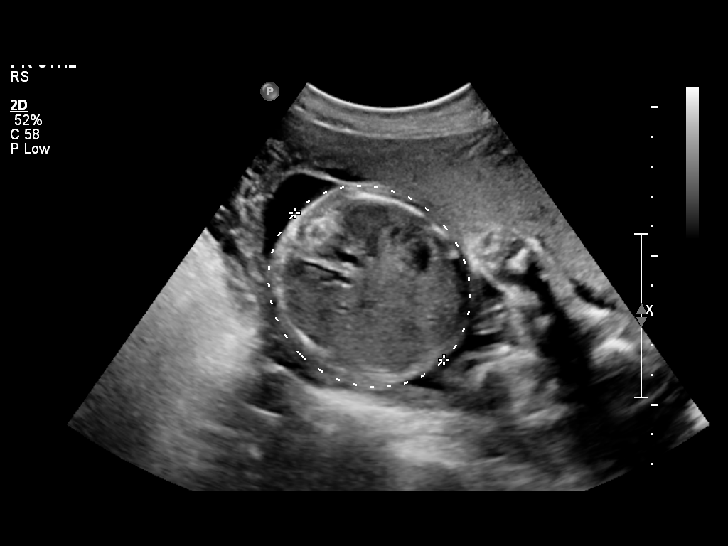
[im 29/41]
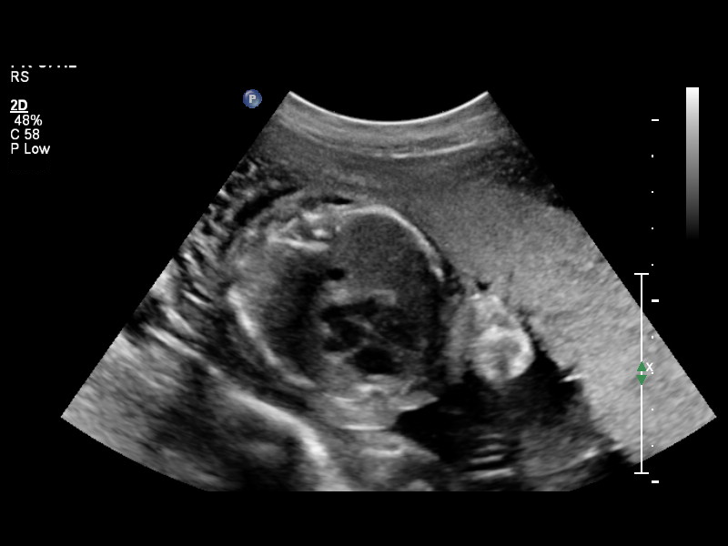
[im 32/41]
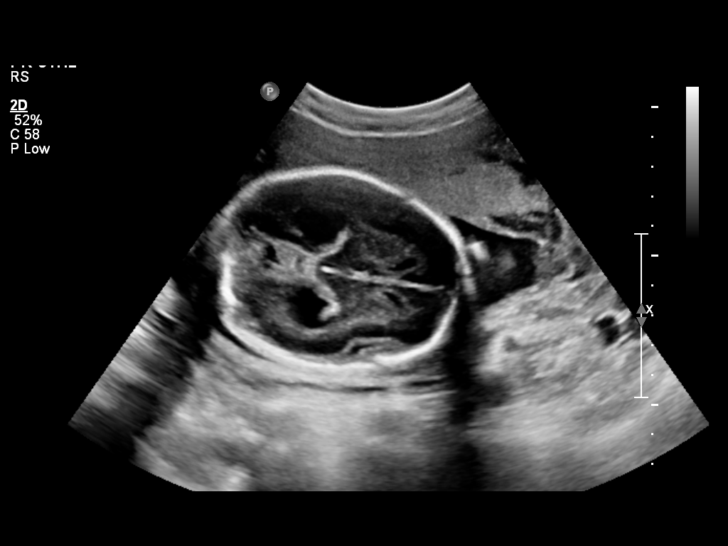
[im 35/41]
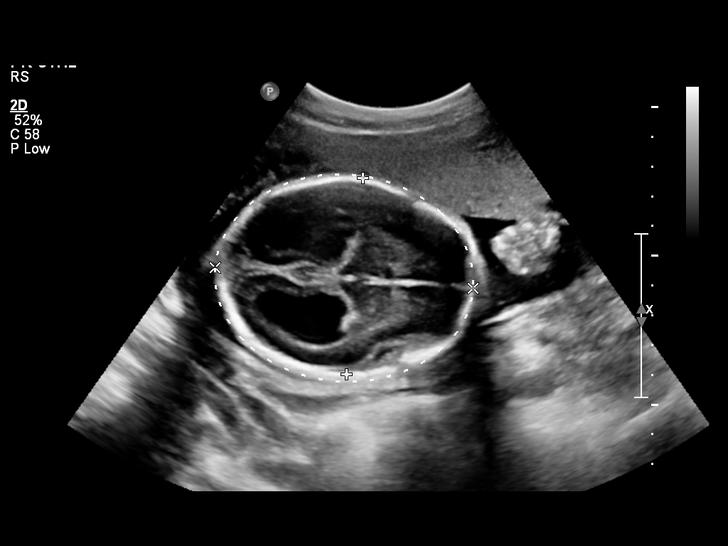
[im 38/41]
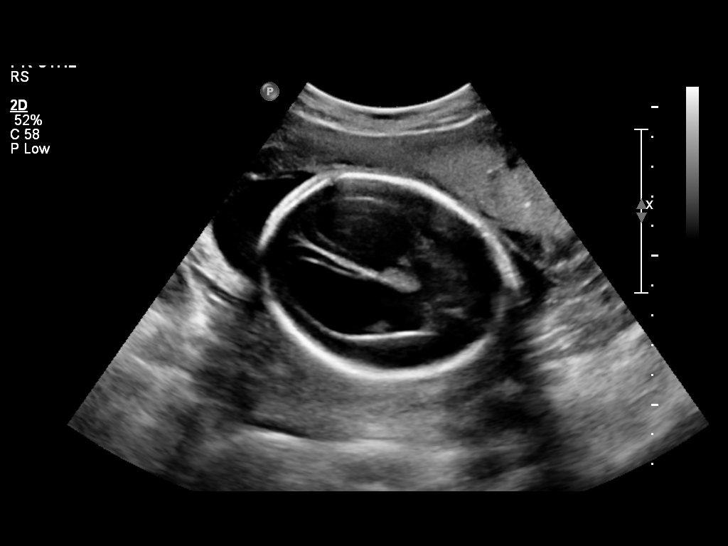
[im 41/41]
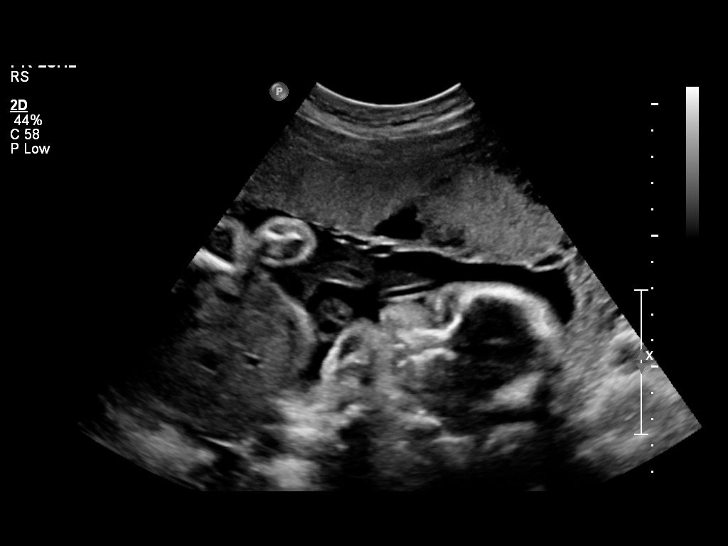

[14 of 28 positions shown; findings below may reference images not displayed]

IMPRESSION: [facility] has also been faxed to the ordering physician.

## 2012-01-19 ENCOUNTER — Telehealth: Payer: Self-pay | Admitting: Emergency Medicine

## 2012-01-19 NOTE — Telephone Encounter (Signed)
LMOM for pt TCB 

## 2012-01-20 NOTE — Telephone Encounter (Signed)
LMOMTCB x 1 

## 2012-01-21 NOTE — Telephone Encounter (Signed)
lmomtcb  

## 2012-01-22 NOTE — Telephone Encounter (Signed)
Spoke with pt. She states has been seeing cards regarding CP that bothers her off and on. They rec that RB order a CPST to be done. She wants to know if this can be done prior to her appt. I advised that RB out of the office at this time and is not returning until 02/09/12 which is the day she has her appt. She states not currently having any CP and has not had any changed in her breathing. I advised that she keep her appt and if CP returns or she has new symptoms call for appt or seek emergent care. Pt verbalized understanding.

## 2012-01-28 IMAGING — US US OB FOLLOW-UP
1 series · 14 of 28 positions shown · non-contrast
Comparison: none

OBSTETRICAL ULTRASOUND:
 This ultrasound was performed in The [HOSPITAL], and the [facility] report will be stored to [REDACTED] PACS.  This report is also available in [HOSPITAL]?s accessANYware.

[Series 1: us ob follow-up · 14 of 66 slices shown]
[im 3/66]
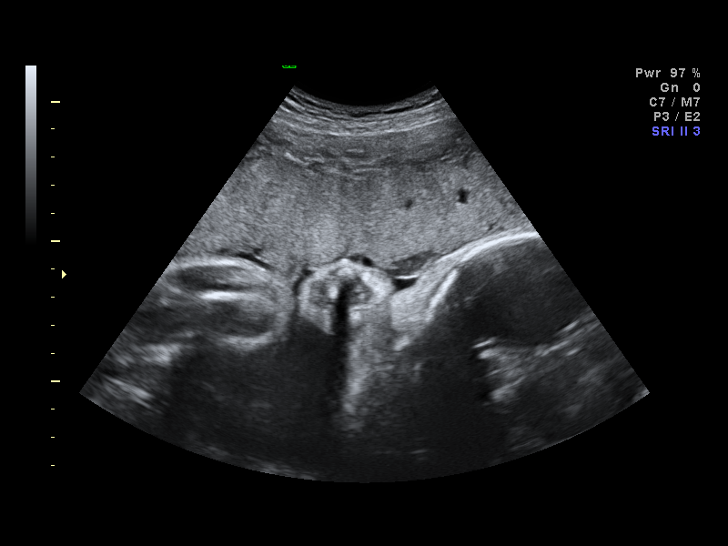
[im 8/66]
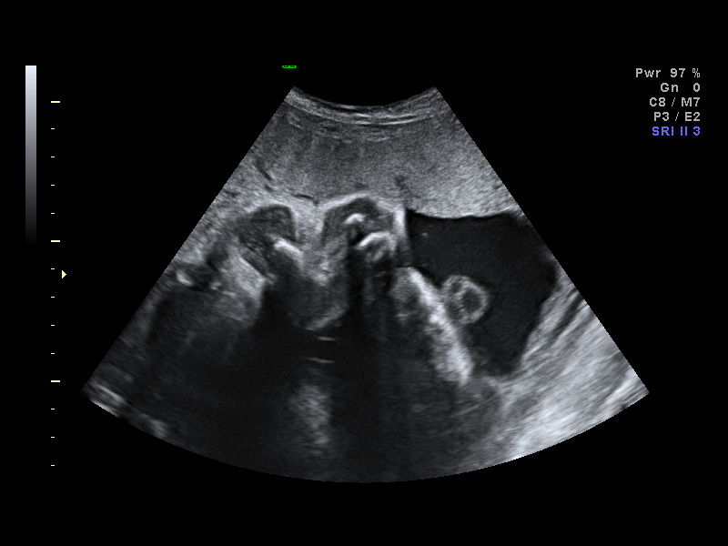
[im 13/66]
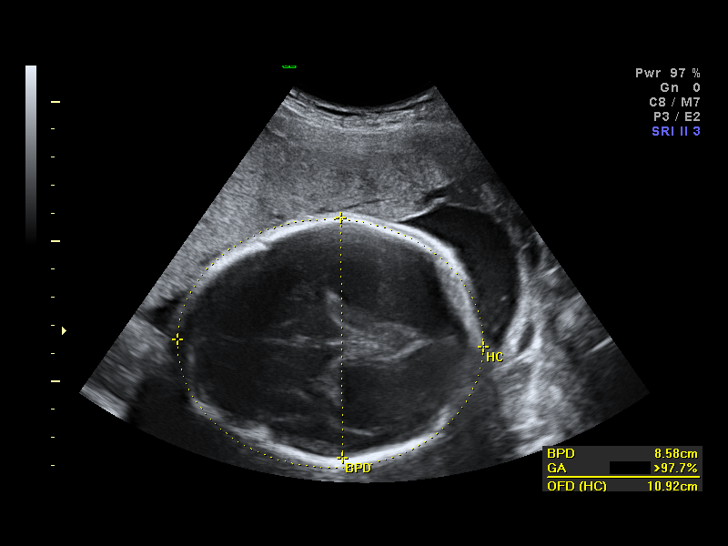
[im 17/66]
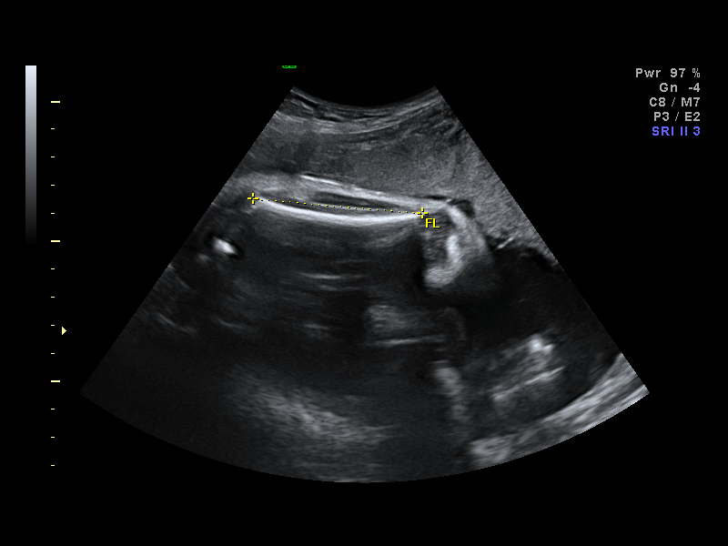
[im 22/66]
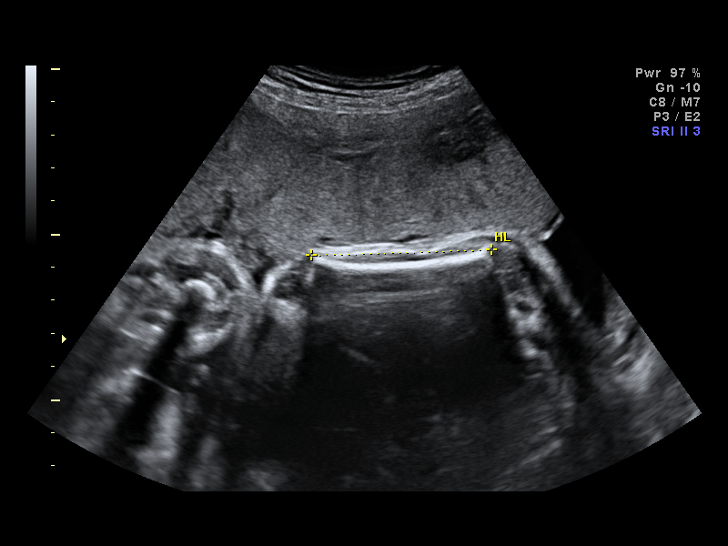
[im 27/66]
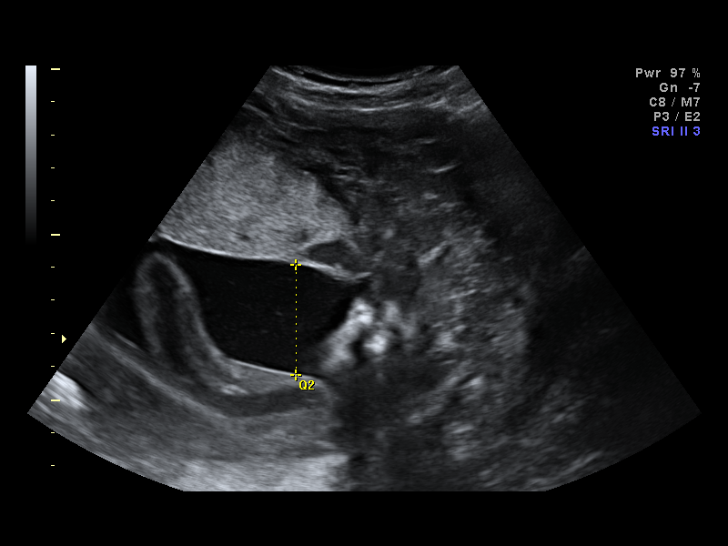
[im 32/66]
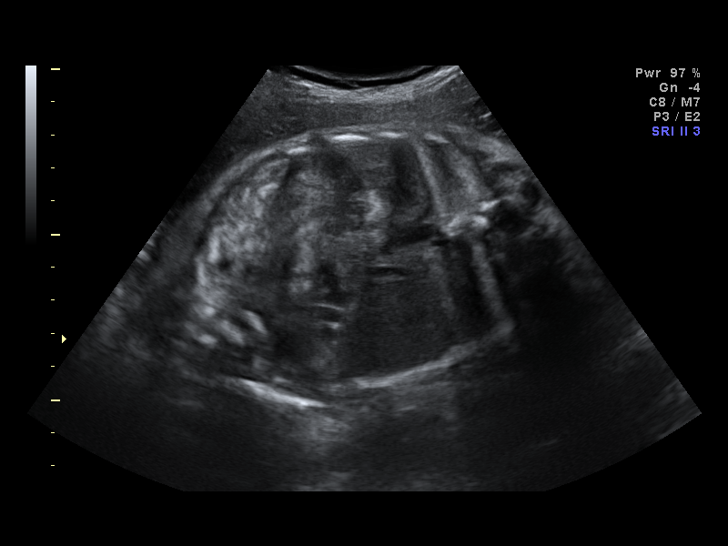
[im 37/66]
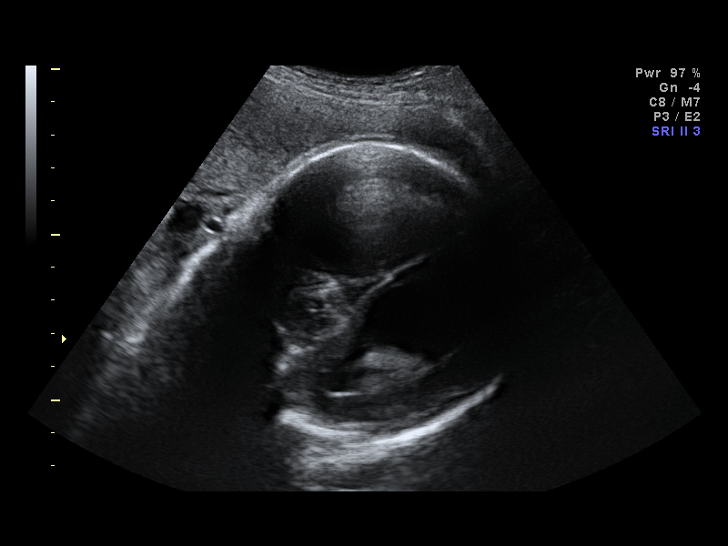
[im 41/66]
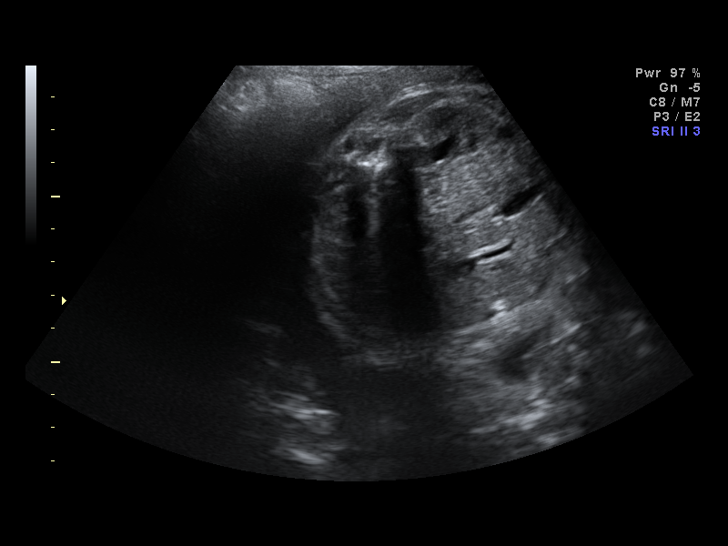
[im 46/66]
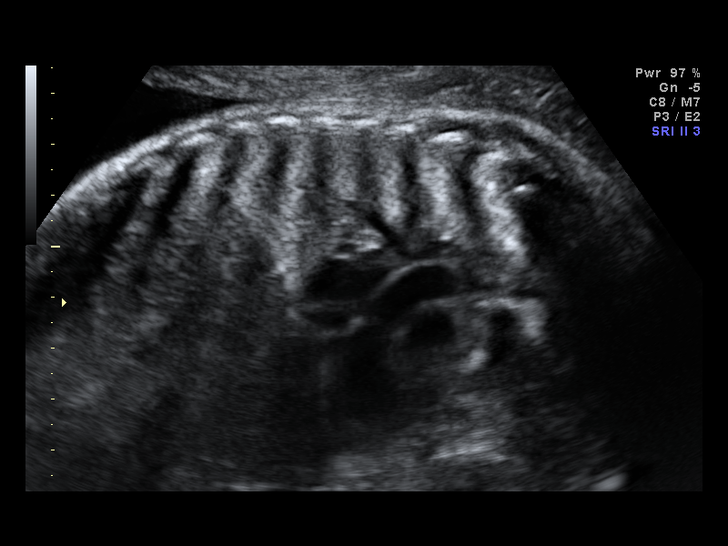
[im 51/66]
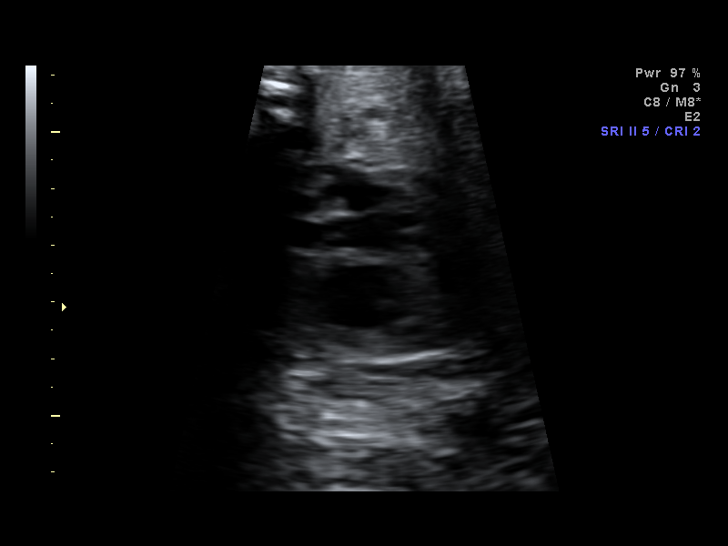
[im 56/66]
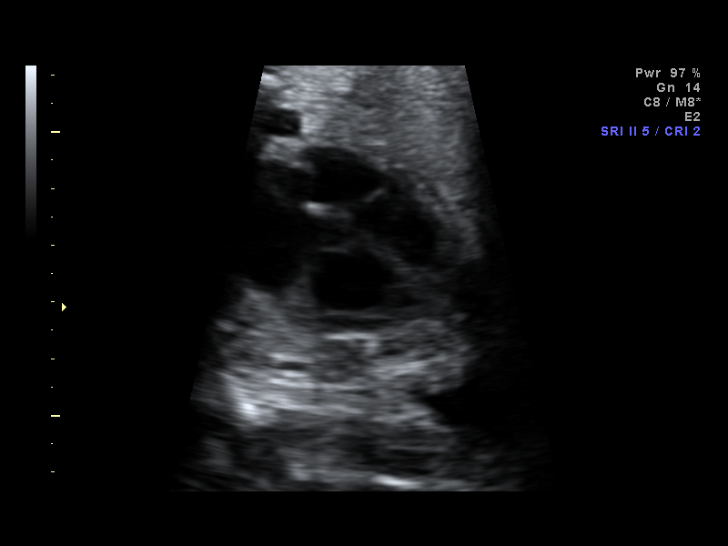
[im 61/66]
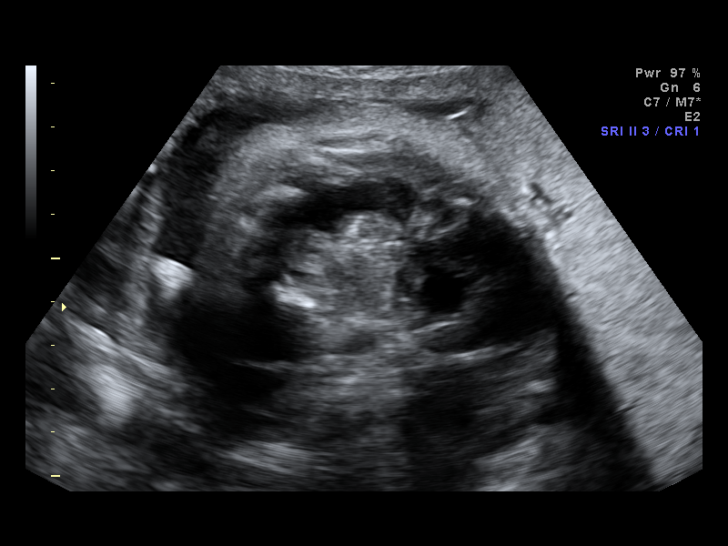
[im 66/66]
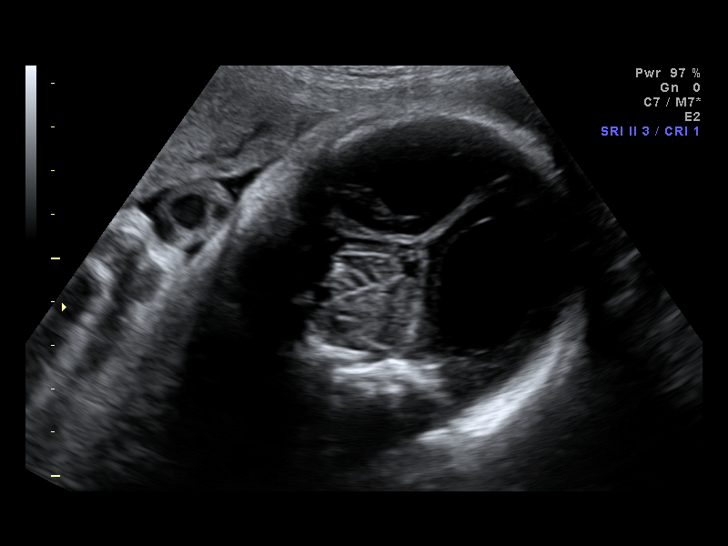

[14 of 28 positions shown; findings below may reference images not displayed]

IMPRESSION: [facility] has also been faxed to the ordering physician.

## 2012-02-09 ENCOUNTER — Encounter: Payer: Self-pay | Admitting: Emergency Medicine

## 2012-02-09 ENCOUNTER — Ambulatory Visit (INDEPENDENT_AMBULATORY_CARE_PROVIDER_SITE_OTHER): Payer: Medicaid Other | Admitting: Emergency Medicine

## 2012-02-09 VITALS — BP 112/76 | HR 91 | Temp 98.2°F | Ht 70.0 in | Wt 146.4 lb

## 2012-02-09 DIAGNOSIS — J45909 Unspecified asthma, uncomplicated: Secondary | ICD-10-CM

## 2012-02-09 DIAGNOSIS — R0609 Other forms of dyspnea: Secondary | ICD-10-CM

## 2012-02-09 DIAGNOSIS — R0989 Other specified symptoms and signs involving the circulatory and respiratory systems: Secondary | ICD-10-CM

## 2012-02-09 DIAGNOSIS — K219 Gastro-esophageal reflux disease without esophagitis: Secondary | ICD-10-CM

## 2012-02-09 NOTE — Assessment & Plan Note (Signed)
Not clear that this is her asthma but cardiac screening has been reassuring.  - agree with CPST to see if we can elicit sx and correlate with spirometry - rova after the CPST

## 2012-02-09 NOTE — Patient Instructions (Signed)
We will perform a cardiopulmonary stress test  Use your albuterol after exercise if chest symptoms occur to see if this is helpful Follow with Dr Delton Coombes after the stress testing to discuss the results

## 2012-02-09 NOTE — Progress Notes (Signed)
Subjective:    Patient ID: Christina Tyler, female    DOB: Jun 26, 1981, 31 y.o.   MRN: [id number] HPI 31 yo never smoker, hx of childhood asthma and allergies not meds at that time, grew out of it. Then began to have more symptoms in early 20's. Dx with asthma with spirometry 2-3 yrs ago, was started on albuterol. Currently on QVAR and combivent, managed by [facility], [place]. Was not on meds during recent pregnancy (delivered April 24), symptoms worse. Had to be re-admitted to [facility] a week after the C-section.   Tells me that she has had progressive dyspnea, chest tightness, pain in back and chest; has cough, worst at night when lying flat, can wake her from sleep. Has had GERD in the past, doesn't complain of GERD now. Having allergy symptoms, worse this Spring.   ROV 04/09/10 -- returns for f/u probable asthma, GERD and allergies. We started allergy regimen last time, with success. She has URI symptoms x 1 week, rx for L ear infxn, has made her cough worse. Also has needed some SABA this week, was not requiring it prior.   ROV 05/15/10 -- returns for eval of allergies, cough and asthma. Since last time has been doing well. Adding loratadine and nasal steroid has helped her cough. No exacerbations since last time. She had methacholine on 05/07/10 that showed no airways hyperresponsiveness, but this study was done while she was on the QVAR two times a day!   ROV 07/30/10 -- asthma, cough, allergies. negative methacholine in 05/07/10 (on her QVAR!). We stopped QVAR last time, she has tolerated this well. hasn't required frequent SABA use. Has gone back to exercising. Occas gets cough and chest tightness at night while lying flat.   ROV 02/12/11 -- asthma, allergies, cough. Has done well since 9/11. She can tell that her allergies are starting to act up. Drainage at night. She hasn't had to increase Ventolin usage. No exacerbations, no hospitalizations since last time.   ROV 08/13/11 -- asthma,  allergies, cough. Newly dx of SLE made since our last visit - discovered with anemia and thrombocytopenia. Not currently on medication. Asthma has been well managed until recent weather change, using SABA 2 -3 x a week. Needs flu shot.   ROV 10/06/11 -- asthma, allergies, cough. SLE dx as above. Very minimal prior tobacco exposure. Her asthma has been fairly stable. She had a recent CXR when being evaluated for flank pain, showed ? 6 mm RUL nodule.   ROV 02/09/12 -- asthma, allergies, cough, SLE. CT scan showed no evidence pulm nodule 10/09/11.  Has been seen for exertional chest pain by [facility] in [place] - stress testing showed no evidence ischemia, echocardiogram showed normal LV fxn.  ? Needs CPST. She gets exertional CP, tightness, SOB. No longer on QVAR. Has not used SABA with any frequency.    Objective:   Physical Exam Gen: Pleasant, well-nourished, in no distress,  normal affect  ENT: No lesions,  mouth clear,  oropharynx clear, no postnasal drip  Neck: No JVD, no TMG, no carotid bruits  Lungs: No use of accessory muscles, no dullness to percussion, clear without rales or rhonchi  Cardiovascular: RRR, heart sounds normal, no murmur or gallops, no peripheral edema  Musculoskeletal: No deformities, no cyanosis or clubbing  Neuro: alert, non focal  Skin: Warm, no lesions or rashes    Assessment & Plan:  Exertional dyspnea Not clear that this is her asthma but cardiac screening has been reassuring.  -  agree with CPST to see if we can elicit sx and correlate with spirometry - rova after the CPST  ASTHMA Hold off on restarting QVAR for now, although the CPST may indicate that control has worsened and will need to restart    GERD ? Whether this is a component, consider empiric GERD rx if the CPST is resassuring

## 2012-02-09 NOTE — Assessment & Plan Note (Signed)
?   Whether this is a component, consider empiric GERD rx if the CPST is resassuring

## 2012-02-09 NOTE — Assessment & Plan Note (Signed)
Hold off on restarting QVAR for now, although the CPST may indicate that control has worsened and will need to restart

## 2012-02-17 ENCOUNTER — Ambulatory Visit (HOSPITAL_COMMUNITY): Payer: Medicaid Other | Attending: Emergency Medicine

## 2012-02-17 DIAGNOSIS — R0609 Other forms of dyspnea: Secondary | ICD-10-CM

## 2012-02-17 DIAGNOSIS — R0989 Other specified symptoms and signs involving the circulatory and respiratory systems: Secondary | ICD-10-CM | POA: Insufficient documentation

## 2012-03-02 ENCOUNTER — Telehealth: Payer: Self-pay | Admitting: Emergency Medicine

## 2012-03-02 ENCOUNTER — Ambulatory Visit: Payer: Medicaid Other | Admitting: Emergency Medicine

## 2012-03-02 NOTE — Telephone Encounter (Signed)
Pt was coming in today to discuss CPST results.  Dr. Delton Coombes, will you call pt regarding these results or advise of results so we can call her?

## 2012-03-04 NOTE — Telephone Encounter (Signed)
Results are still preliminary in epic.  Dr Delton Coombes, please advise thanks.

## 2012-03-04 NOTE — Telephone Encounter (Signed)
Please let her know I am waiting for Dr Jane Canary formal interpretation, should be available next week

## 2012-03-05 NOTE — Telephone Encounter (Signed)
lmomtcb x1 for pt 

## 2012-03-05 NOTE — Telephone Encounter (Signed)
Pt returned call.  Advised pt that RB is waiting for MR's formal interpretation which should be available next week.  Pt confirmed understanding & stated nothing further needed at this time.  Christina Tyler

## 2012-03-17 DIAGNOSIS — R0602 Shortness of breath: Secondary | ICD-10-CM

## 2012-05-21 ENCOUNTER — Telehealth: Payer: Self-pay | Admitting: Emergency Medicine

## 2012-05-21 MED ORDER — ALBUTEROL SULFATE HFA 108 (90 BASE) MCG/ACT IN AERS: 2.0000 | INHALATION_SPRAY | Freq: Four times a day (QID) | RESPIRATORY_TRACT | Status: DC | PRN

## 2012-05-21 NOTE — Telephone Encounter (Signed)
Pt aware that RX has been sent to pharmacy. 

## 2012-05-25 ENCOUNTER — Telehealth: Payer: Self-pay | Admitting: Emergency Medicine

## 2012-05-25 ENCOUNTER — Ambulatory Visit (INDEPENDENT_AMBULATORY_CARE_PROVIDER_SITE_OTHER): Payer: 59 | Admitting: Internal Medicine

## 2012-05-25 ENCOUNTER — Encounter: Payer: Self-pay | Admitting: Internal Medicine

## 2012-05-25 VITALS — BP 86/56 | HR 69 | Temp 98.1°F | Ht 71.0 in | Wt 144.2 lb

## 2012-05-25 DIAGNOSIS — R0600 Dyspnea, unspecified: Secondary | ICD-10-CM

## 2012-05-25 DIAGNOSIS — J45909 Unspecified asthma, uncomplicated: Secondary | ICD-10-CM

## 2012-05-25 DIAGNOSIS — R0989 Other specified symptoms and signs involving the circulatory and respiratory systems: Secondary | ICD-10-CM

## 2012-05-25 MED ORDER — ESOMEPRAZOLE MAGNESIUM 40 MG PO PACK: PACK | ORAL | Status: DC

## 2012-05-25 NOTE — Telephone Encounter (Signed)
I spoke with pt and she is scheduled to come in this afternoon to see MW at 3:00 for acute visit. Nothing further was needed

## 2012-05-25 NOTE — Patient Instructions (Addendum)
Nexium 40 mg Take 30- 60 min before your first and last meals of the day - take automatically until return  GERD (REFLUX)  is an extremely common cause of respiratory symptoms, many times with no significant heartburn at all.    It can be treated with medication, but also with lifestyle changes including avoidance of late meals, excessive alcohol, smoking cessation, and avoid fatty foods, chocolate, peppermint, colas, red wine, and acidic juices such as orange juice.  NO MINT OR MENTHOL PRODUCTS SO NO COUGH DROPS  USE SUGARLESS CANDY INSTEAD (jolley ranchers or Stover's)  NO OIL BASED VITAMINS - use powdered substitutes. NO FISH OIL  If not markedly improved by end of two weeks you need to call [phone] ask Almyra Free to schedule the repeat methacholine while still on acid suppression   If are improved stay on this regimen until you see Dr Delton Coombes in 6 weeks

## 2012-05-25 NOTE — Progress Notes (Signed)
Subjective:    Patient ID: Christina Tyler, female    DOB: [birth year], 31 y.o.   MRN: [id number] HPI 31yo never smoker, hx of childhood asthma and allergies not meds at that time, grew out of it. Then began to have more symptoms in early 20's. Dx with asthma with spirometry 2-3 yrs ago, was started on albuterol. Currently on QVAR and combivent, managed by [facility], [place]. Was not on meds during recent pregnancy (delivered April 24), symptoms worse. Had to be re-admitted to [facility] a week after the C-section.   Tells me that she has had progressive dyspnea, chest tightness, pain in back and chest; has cough, worst at night when lying flat, can wake her from sleep. Has had GERD in the past, doesn't complain of GERD now. Having allergy symptoms, worse this Spring.   ROV 04/09/10 -- returns for f/u probable asthma, GERD and allergies. We started allergy regimen last time, with success. She has URI symptoms x 1 week, rx for L ear infxn, has made her cough worse. Also has needed some SABA this week, was not requiring it prior.   ROV 05/15/10 -- returns for eval of allergies, cough and asthma. Since last time has been doing well. Adding loratadine and nasal steroid has helped her cough. No exacerbations since last time. She had methacholine on 05/07/10 that showed no airways hyperresponsiveness, but this study was done while she was on the QVAR two times a day!   ROV 07/30/10 -- asthma, cough, allergies. negative methacholine in 05/07/10 (on her QVAR!). We stopped QVAR last time, she has tolerated this well. hasn't required frequent SABA use. Has gone back to exercising. Occas gets cough and chest tightness at night while lying flat.   ROV 02/12/11 -- asthma, allergies, cough. Has done well since 9/11. She can tell that her allergies are starting to act up. Drainage at night. She hasn't had to increase Ventolin usage. No exacerbations, no hospitalizations since last time.   ROV 08/13/11 -- asthma,  allergies, cough. Newly dx of SLE made since our last visit - discovered with anemia and thrombocytopenia. Not currently on medication. Asthma has been well managed until recent weather change, using SABA 2 -3 x a week. Needs flu shot.   ROV 10/06/11 -- asthma, allergies, cough. SLE dx as above. Very minimal prior tobacco exposure. Her asthma has been fairly stable. She had a recent CXR when being evaluated for flank pain, showed ? 6 mm RUL nodule.   ROV 02/09/12 -- asthma, allergies, cough, SLE. CT scan showed no evidence pulm nodule 10/09/11.  Has been seen for exertional chest pain by [facility] in [place] - stress testing showed no evidence ischemia, echocardiogram showed normal LV fxn.  ? Needs CPST. She gets exertional CP, tightness, SOB. No longer on QVAR. Has not used SABA with any frequency.   02/17/12 CPST suggested a conditioning issue   05/25/2012 f/u ov/Christina Tyler "blood condition" has her out of work by [employer] at [place] and not able to work out due to heat then 7/19 chest tightness had not required any saba for sev months so restarted saba 7/20 but not consistently responding to it  Except that it almost completely worked w/in 4 hours of the visit.  Still feels tight and wheezy, however.   No unusual cough, purulent sputum or sinus/hb symptoms on present rx.   Sleeping ok without nocturnal  or early am exacerbation  of respiratory  c/o's or need for noct saba. Also denies any obvious  fluctuation of symptoms with weather or environmental changes or other aggravating or alleviating factors except as outlined above   ROS  The following are not active complaints unless bolded sore throat, dysphagia, dental problems, itching, sneezing,  nasal congestion or excess/ purulent secretions, ear ache,   fever, chills, sweats, unintended wt loss, pleuritic or exertional cp, hemoptysis,  orthopnea pnd or leg swelling, presyncope, palpitations, heartburn, abdominal pain, anorexia, nausea, vomiting, diarrhea   or change in bowel or urinary habits, change in stools or urine, dysuria,hematuria,  rash, arthralgias, visual complaints, headache, numbness weakness or ataxia or problems with walking or coordination,  change in mood/affect or memory.         Objective:   Physical Exam Gen: amb wf with very unusual affect, burping frequently  ENT: No lesions,  mouth clear,  oropharynx clear, no postnasal drip  Neck: No JVD, no TMG, no carotid bruits  Lungs: Prominent expiratory wheezing with upper airway features  Cardiovascular: RRR, heart sounds normal, no murmur or gallops, no peripheral edema  Musculoskeletal: No deformities, no cyanosis or clubbing  Neuro: alert, non focal  Skin: Warm, no lesions or rashes    Assessment & Plan:

## 2012-05-26 NOTE — Assessment & Plan Note (Addendum)
-   Spirometry including fef 25-75 nl (117%) during symptoms but w/in 4 h of saba 05/25/12   - HFA 90%  05/25/12   Symptoms are markedly disproportionate to objective findings and not clear this is a lung problem but pt does appear to have difficult airway management issues. DDX of  difficult airways managment all start with A and  include Adherence, Ace Inhibitors, Acid Reflux, Active Sinus Disease, Alpha 1 Antitripsin deficiency, Anxiety masquerading as Airways dz,  ABPA,  allergy(esp in young), Aspiration (esp in elderly), Adverse effects of DPI,  Active smokers, plus two Bs  = Bronchiectasis and Beta blocker use..and one C= CHF  Adherence is always the initial "prime suspect" and is a multilayered concern that requires a "trust but verify" approach in every patient - starting with knowing how to use medications, especially inhalers, correctly, keeping up with refills and understanding the fundamental difference between maintenance and prns vs those medications only taken for a very short course and then stopped and not refilled. The proper method of use, as well as anticipated side effects, of a metered-dose inhaler are discussed and demonstrated to the patient. Improved effectiveness after extensive coaching during this visit to a level of approximately  90% but can use this prn if can get her symptoms under control by other means  ? Acid reflux > belching during exam, takes fish oil rec diet/ ppi bid then regroup.  ? Anxiety> dx of exclusion

## 2012-06-15 ENCOUNTER — Telehealth: Payer: Self-pay | Admitting: Internal Medicine

## 2012-06-15 NOTE — Telephone Encounter (Signed)
pantoprazole 40 mg ok to substitute

## 2012-06-15 NOTE — Telephone Encounter (Signed)
Received a PA for Nexium from [facility] # [id number] Pt Id# [id number] K. Nexium is not covered. Pt must try the alternatives: lansoprazole OTC, omeprazole Rx, or pantoprazole RX. Please advise if any of these meds are an appropriate alternative for the pt. Carron Curie, CMA

## 2012-06-16 NOTE — Telephone Encounter (Signed)
lmomtcb x1 for pt 

## 2012-06-17 MED ORDER — PANTOPRAZOLE SODIUM 40 MG PO TBEC: 40.0000 mg | DELAYED_RELEASE_TABLET | Freq: Every day | ORAL | Status: DC

## 2012-06-17 NOTE — Telephone Encounter (Signed)
lmomtcb x2 for pt on mobile #. Home # listed is no longer in service

## 2012-07-06 ENCOUNTER — Ambulatory Visit: Payer: 59 | Admitting: Emergency Medicine

## 2012-08-06 ENCOUNTER — Ambulatory Visit: Payer: 59 | Admitting: Emergency Medicine

## 2012-08-06 ENCOUNTER — Ambulatory Visit: Payer: Self-pay | Admitting: Adult Health

## 2013-04-21 IMAGING — CT CT HEAD W/O CM
1 of 2 series · 16 of 30 positions shown, 20 images · non-contrast
Comparison: MR 11/28/2010

CLINICAL DATA: Occipital headache

CT HEAD WITHOUT CONTRAST
TECHNIQUE: Contiguous axial images were obtained from the base of
the skull through the vertex without contrast.

[Series 3: recon 2: brain · axial · 0.47mm/px · z∈[+120,+252]mm · 16 of 64 slices shown, 20 images]
[im 4/64  brain]
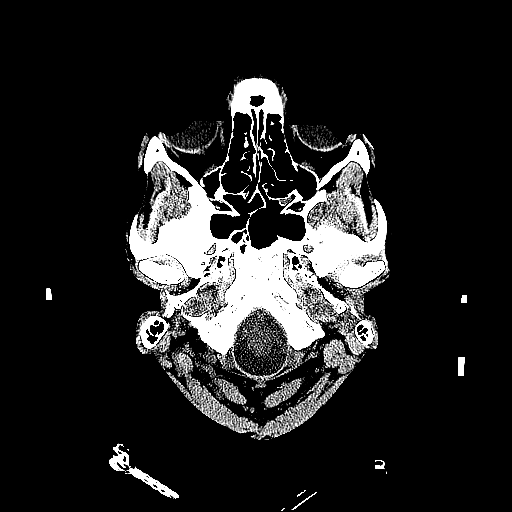
[im 4/64  bone]
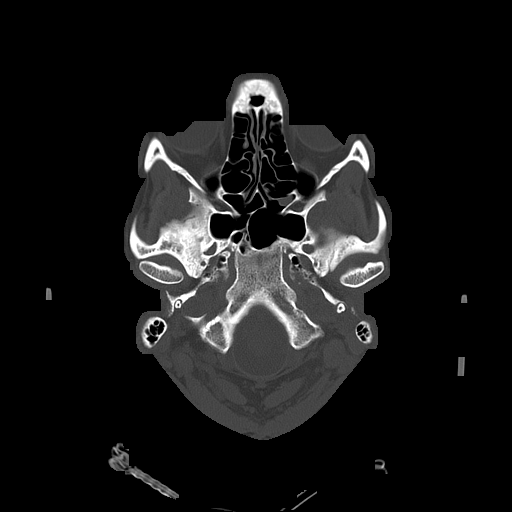
[im 7/64  brain]
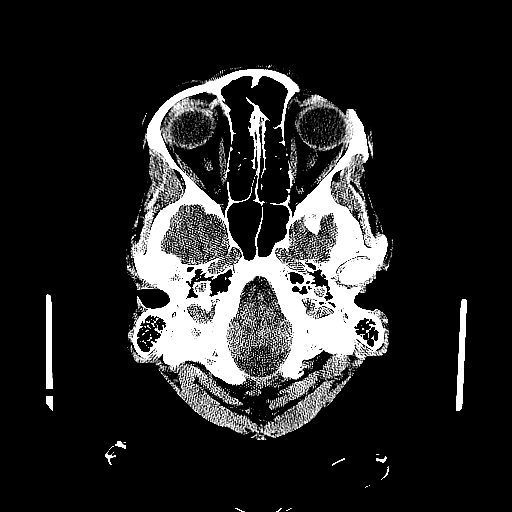
[im 10/64  brain]
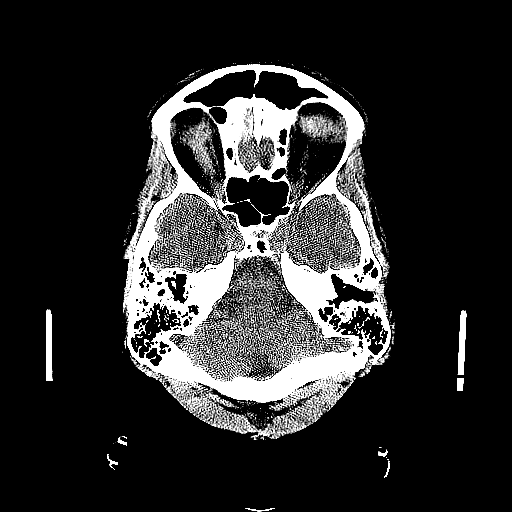
[im 14/64  brain]
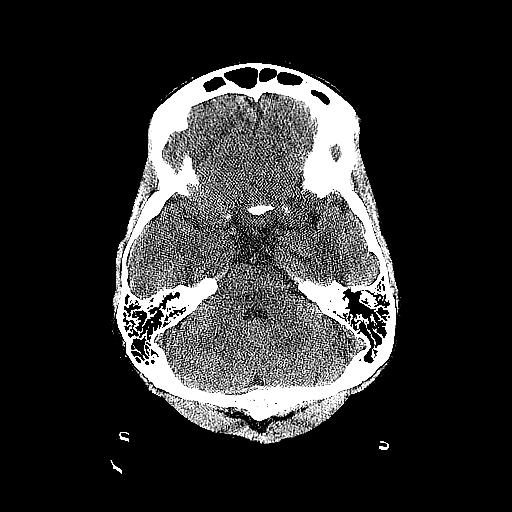
[im 17/64  brain]
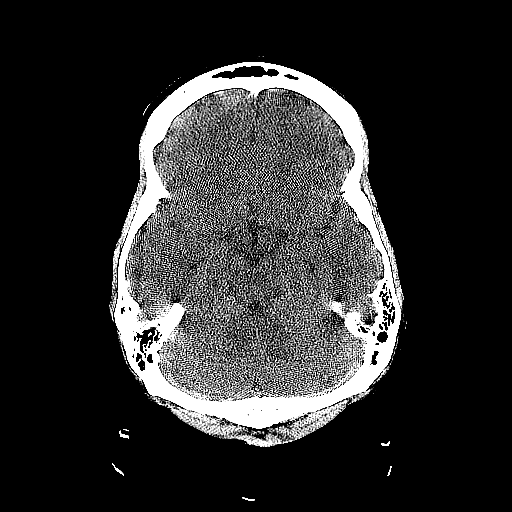
[im 17/64  bone]
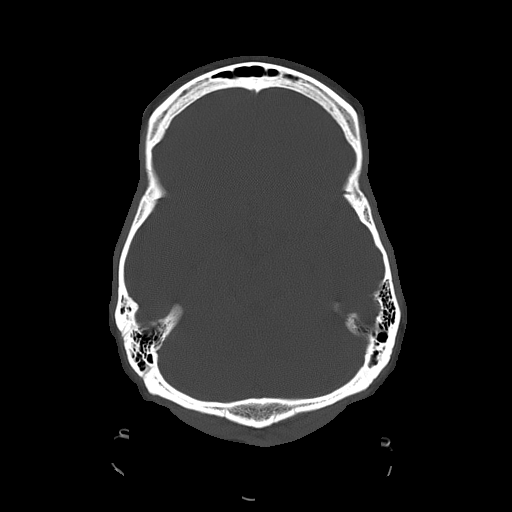
[im 20/64  brain]
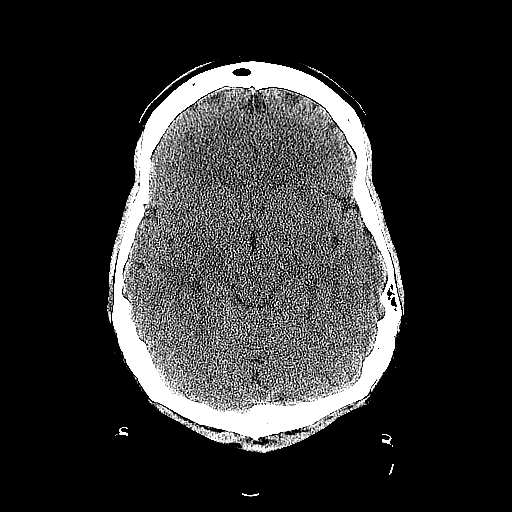
[im 24/64  brain]
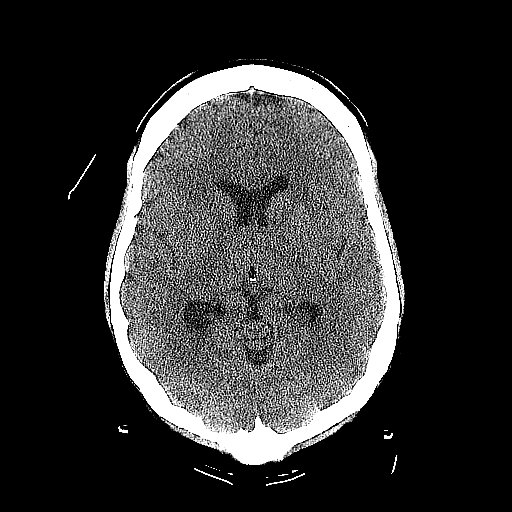
[im 27/64  brain]
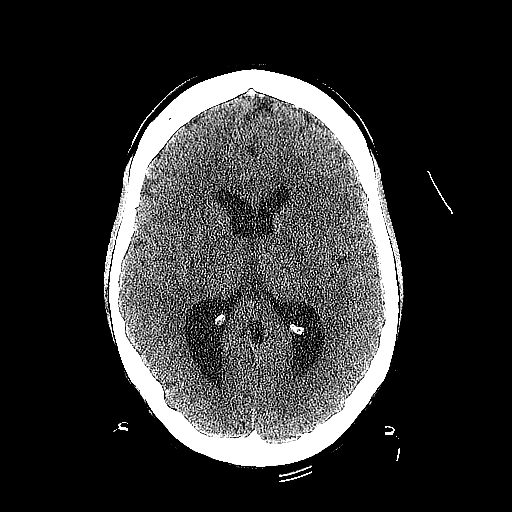
[im 34/64  brain]
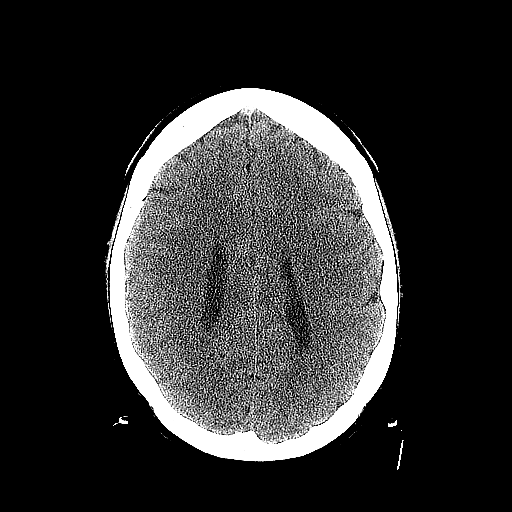
[im 34/64  bone]
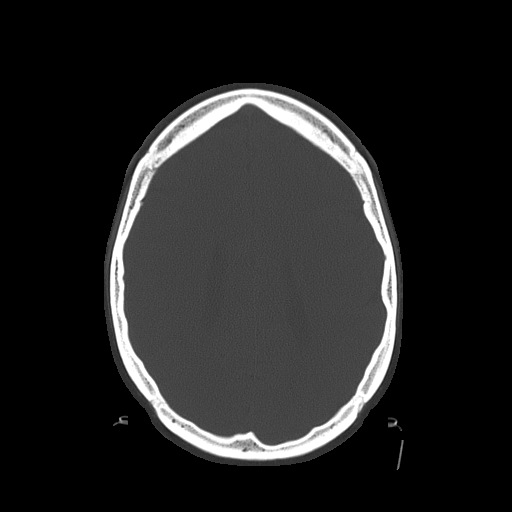
[im 37/64  brain]
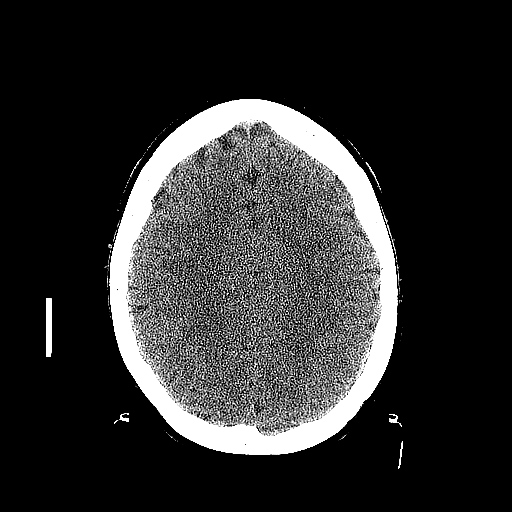
[im 40/64  brain]
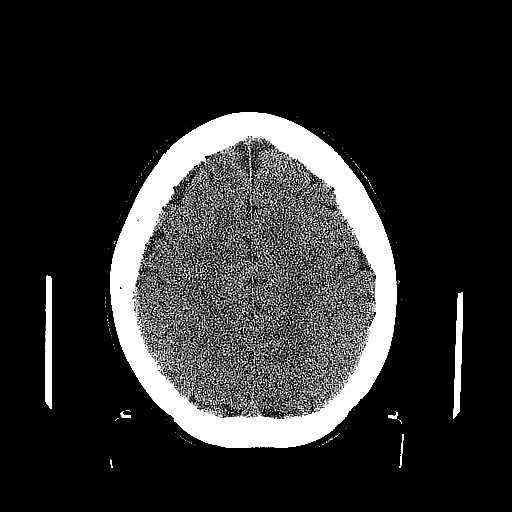
[im 44/64  brain]
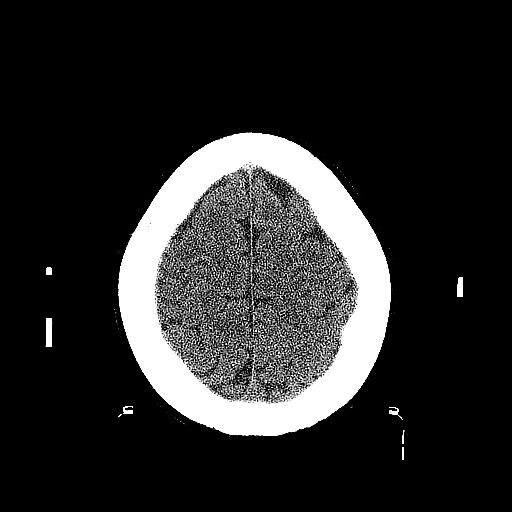
[im 47/64  brain]
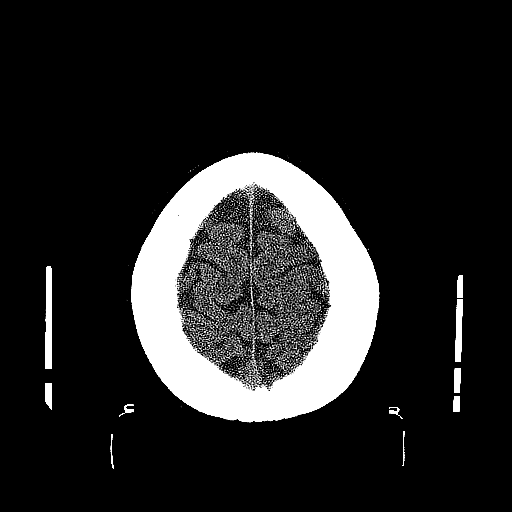
[im 47/64  bone]
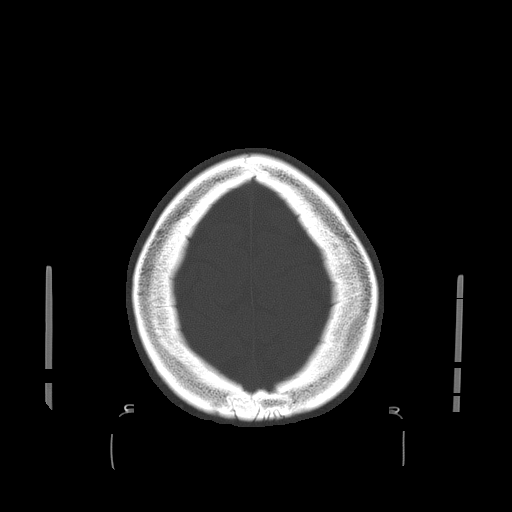
[im 50/64  brain]
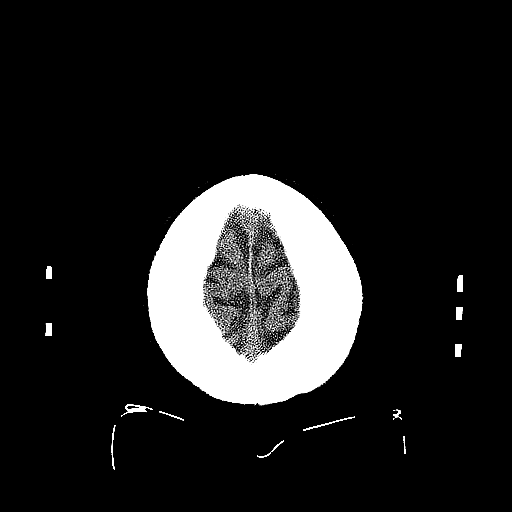
[im 54/64  brain]
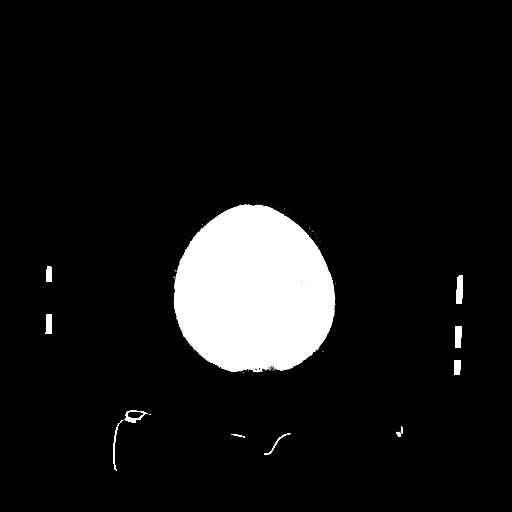
[im 57/64  brain]
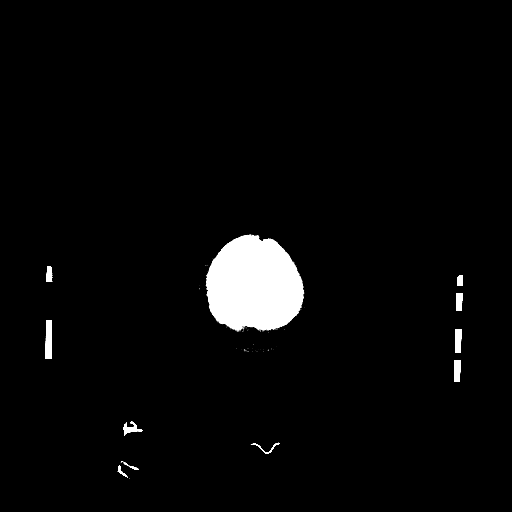

[16 of 30 positions shown; findings below may reference images not displayed]

FINDINGS: Occipital horns of the ventricles are prominent with a
couple centrally.  This can be seen with white matter atrophy in
the occipital lobes.  There is no evidence of dilatation of the
temporal horns or dilatation of the third ventricle.  No mass
effect or midline shift.  No acute intracranial hemorrhage.
IMPRESSION: No acute intracranial pathology.  Chronic changes are noted.

## 2015-04-25 DIAGNOSIS — M797 Fibromyalgia: Secondary | ICD-10-CM | POA: Insufficient documentation

## 2016-08-15 DIAGNOSIS — R6889 Other general symptoms and signs: Secondary | ICD-10-CM | POA: Insufficient documentation

## 2016-08-15 DIAGNOSIS — Q612 Polycystic kidney, adult type: Secondary | ICD-10-CM | POA: Insufficient documentation

## 2019-01-03 ENCOUNTER — Ambulatory Visit (INDEPENDENT_AMBULATORY_CARE_PROVIDER_SITE_OTHER): Payer: Medicaid Other

## 2019-01-03 ENCOUNTER — Other Ambulatory Visit: Payer: Self-pay

## 2019-01-03 ENCOUNTER — Ambulatory Visit (INDEPENDENT_AMBULATORY_CARE_PROVIDER_SITE_OTHER): Payer: Medicaid Other | Admitting: Podiatry

## 2019-01-03 ENCOUNTER — Encounter: Payer: Self-pay | Admitting: Podiatry

## 2019-01-03 VITALS — BP 102/71 | HR 85 | Resp 16 | Ht 71.0 in | Wt 170.0 lb

## 2019-01-03 DIAGNOSIS — M21862 Other specified acquired deformities of left lower leg: Secondary | ICD-10-CM

## 2019-01-03 DIAGNOSIS — M7752 Other enthesopathy of left foot: Secondary | ICD-10-CM

## 2019-01-03 DIAGNOSIS — M79672 Pain in left foot: Secondary | ICD-10-CM

## 2019-01-03 DIAGNOSIS — M722 Plantar fascial fibromatosis: Secondary | ICD-10-CM

## 2019-01-03 DIAGNOSIS — M216X2 Other acquired deformities of left foot: Secondary | ICD-10-CM | POA: Diagnosis not present

## 2019-01-03 DIAGNOSIS — M7732 Calcaneal spur, left foot: Secondary | ICD-10-CM

## 2019-01-03 MED ORDER — MELOXICAM 15 MG PO TABS: 15.0000 mg | ORAL_TABLET | Freq: Every day | ORAL | 0 refills | Status: DC

## 2019-01-03 MED ORDER — METHYLPREDNISOLONE 4 MG PO TBPK: ORAL_TABLET | ORAL | 0 refills | Status: AC

## 2019-01-03 NOTE — Patient Instructions (Signed)

## 2019-01-03 NOTE — Progress Notes (Signed)
   Subjective:    Patient ID: Christina Tyler, female    DOB: 1981-01-05, 38 y.o.   MRN: [id number]  HPI    Review of Systems  Musculoskeletal: Positive for arthralgias and myalgias.  All other systems reviewed and are negative.      Objective:   Physical Exam        Assessment & Plan:

## 2019-01-03 NOTE — Progress Notes (Signed)
Subjective:  Patient ID: Christina Tyler, female    DOB: [birth year],  MRN: [id number]  Chief Complaint  Patient presents with  . Pain    Lt bottom heel painx 3 mo; 10/10 shooting-burning pain - no injury Tx: rx muscle relaxer and massage -worse with standing and walking     38 y.o. female presents with the above complaint.   Review of Systems: Negative except as noted in the HPI. Denies N/V/F/Ch.  Past Medical History:  Diagnosis Date  . Asthma   . Lupus (HCC)   . Polycystic kidney disease     Current Outpatient Medications:  .  albuterol (PROAIR HFA) 108 (90 BASE) MCG/ACT inhaler, Inhale 2 puffs into the lungs every 6 (six) hours as needed., Disp: , Rfl:  .  amitriptyline (ELAVIL) 50 MG tablet, TK 1 T PO QHS, Disp: , Rfl:  .  tiZANidine (ZANAFLEX) 4 MG tablet, TK 1 C PO BID, Disp: , Rfl:  .  meloxicam (MOBIC) 15 MG tablet, Take 1 tablet (15 mg total) by mouth daily. Start day after completion of steroid pack, Disp: 30 tablet, Rfl: 0 .  methylPREDNISolone (MEDROL DOSEPAK) 4 MG TBPK tablet, 6 Day Taper Pack. Take as Directed., Disp: 21 tablet, Rfl: 0  Social History   Tobacco Use  Smoking Status Never Smoker  Smokeless Tobacco Never Used    Allergies  Allergen Reactions  . Demerol Shortness Of Breath  . Duloxetine Other (See Comments) and Shortness Of Breath  . Metoclopramide Other (See Comments) and Shortness Of Breath  . Tape Hives and Other (See Comments)    paper tape ok paper tape ok   . Aspirin     Difficulty breathing  . Cymbalta [Duloxetine Hcl]     resp failure per pt  . Dilaudid [Hydromorphone Hcl]     Chest pain, difficulty breathing, racing heart  . Nortriptyline Swelling and Other (See Comments)    Asthma attack as well   Objective:   Vitals:   01/03/19 1016  BP: 102/71  Pulse: 85  Resp: 16   Body mass index is 23.71 kg/m. Constitutional Well developed. Well nourished.  Vascular Dorsalis pedis pulses palpable bilaterally. Posterior tibial  pulses palpable bilaterally. Capillary refill normal to all digits.  No cyanosis or clubbing noted. Pedal hair growth normal.  Neurologic Normal speech. Oriented to person, place, and time. Epicritic sensation to light touch grossly present bilaterally.  Dermatologic Nails well groomed and normal in appearance. No open wounds. No skin lesions.  Orthopedic: Normal joint ROM without pain or crepitus bilaterally. No visible deformities. Tender to palpation at the calcaneal tuber left. No pain with calcaneal squeeze left. Ankle ROM diminished range of motion left. Silfverskiold Test: positive left.   Radiographs: Taken and reviewed. No acute fractures or dislocations. No evidence of stress fracture.  Plantar heel spur absent. Posterior heel spur present.   Assessment:   1. Plantar fasciitis   2. Pain of left heel   3. Gastrocnemius equinus of left lower extremity   4. Bursitis of heel, left   5. Heel spur, left    Plan:  Patient was evaluated and treated and all questions answered.  Plantar Fasciitis, left - XR reviewed as above.  - Educated on icing and stretching. Instructions given.  - Injection delivered to the plantar fascia as below. - Pharmacologic management: Medrol pack and Meloxicam. Only to take meloxicam after completing steroid pack. Educated on risks/benefits and proper taking of medication.  Procedure: Injection Tendon/Ligament Location: Left plantar fascia  at the glabrous junction; medial approach. Skin Prep: alcohol Injectate: 1 cc 0.5% marcaine plain, 1 cc dexamethasone phosphate, 0.5 cc kenalog 10. Disposition: Patient tolerated procedure well. Injection site dressed with a band-aid.  Return in about 6 weeks (around 02/14/2019) for Plantar fasciitis, Left.

## 2019-01-04 ENCOUNTER — Other Ambulatory Visit: Payer: Self-pay | Admitting: Podiatry

## 2019-01-04 DIAGNOSIS — M7752 Other enthesopathy of left foot: Secondary | ICD-10-CM

## 2019-01-04 DIAGNOSIS — M722 Plantar fascial fibromatosis: Secondary | ICD-10-CM

## 2019-01-10 ENCOUNTER — Telehealth: Payer: Self-pay | Admitting: *Deleted

## 2019-01-10 NOTE — Telephone Encounter (Signed)
Patient called to notify us she had an allergic reaction the the steroid dose pack red swollen itchy face.  She notified her pharmacy and stopped it.

## 2019-01-10 NOTE — Telephone Encounter (Signed)
Noted thanks °

## 2019-01-29 ENCOUNTER — Other Ambulatory Visit: Payer: Self-pay | Admitting: Podiatry

## 2019-02-14 ENCOUNTER — Other Ambulatory Visit: Payer: Self-pay

## 2019-02-14 ENCOUNTER — Ambulatory Visit: Payer: Medicaid Other | Admitting: Podiatry

## 2019-02-14 DIAGNOSIS — D509 Iron deficiency anemia, unspecified: Secondary | ICD-10-CM | POA: Insufficient documentation

## 2024-07-22 ENCOUNTER — Other Ambulatory Visit: Payer: Self-pay | Admitting: Medical Genetics

## 2024-10-13 ENCOUNTER — Inpatient Hospital Stay (HOSPITAL_COMMUNITY)
Admission: RE | Admit: 2024-10-13 | Discharge: 2024-10-13 | Payer: MEDICAID | Attending: Medical Genetics | Admitting: Medical Genetics

## 2024-10-25 LAB — GENECONNECT MOLECULAR SCREEN: Genetic Analysis Overall Interpretation: NEGATIVE
